# Patient Record
Sex: Male | Born: 1945 | Race: Black or African American | Hispanic: No | Marital: Married | Smoking: Former smoker
Health system: Southern US, Community
[De-identification: ages and names within clinical notes are randomized; demographics above are authoritative.]

## PROBLEM LIST (undated history)

## (undated) DIAGNOSIS — I429 Cardiomyopathy, unspecified: Secondary | ICD-10-CM

## (undated) DIAGNOSIS — H409 Unspecified glaucoma: Secondary | ICD-10-CM

## (undated) DIAGNOSIS — I10 Essential (primary) hypertension: Secondary | ICD-10-CM

## (undated) DIAGNOSIS — I2510 Atherosclerotic heart disease of native coronary artery without angina pectoris: Secondary | ICD-10-CM

## (undated) DIAGNOSIS — I219 Acute myocardial infarction, unspecified: Secondary | ICD-10-CM

## (undated) DIAGNOSIS — E119 Type 2 diabetes mellitus without complications: Secondary | ICD-10-CM

## (undated) DIAGNOSIS — N189 Chronic kidney disease, unspecified: Secondary | ICD-10-CM

## (undated) DIAGNOSIS — I499 Cardiac arrhythmia, unspecified: Secondary | ICD-10-CM

## (undated) DIAGNOSIS — E7800 Pure hypercholesterolemia, unspecified: Secondary | ICD-10-CM

---

## 2008-02-06 ENCOUNTER — Emergency Department: Payer: Self-pay | Admitting: Emergency Medicine

## 2008-02-06 ENCOUNTER — Other Ambulatory Visit: Payer: Self-pay

## 2008-02-09 ENCOUNTER — Ambulatory Visit: Payer: Self-pay | Admitting: Unknown Physician Specialty

## 2011-10-05 ENCOUNTER — Emergency Department: Payer: Self-pay | Admitting: *Deleted

## 2011-10-05 LAB — COMPREHENSIVE METABOLIC PANEL
Albumin: 3.7 g/dL (ref 3.4–5.0)
Alkaline Phosphatase: 69 U/L (ref 50–136)
Bilirubin,Total: 0.4 mg/dL (ref 0.2–1.0)
Co2: 26 mmol/L (ref 21–32)
Creatinine: 2.07 mg/dL — ABNORMAL HIGH (ref 0.60–1.30)
EGFR (African American): 41 — ABNORMAL LOW
EGFR (Non-African Amer.): 34 — ABNORMAL LOW
Glucose: 130 mg/dL — ABNORMAL HIGH (ref 65–99)
SGOT(AST): 30 U/L (ref 15–37)
SGPT (ALT): 27 U/L
Sodium: 138 mmol/L (ref 136–145)
Total Protein: 7.8 g/dL (ref 6.4–8.2)

## 2011-10-05 LAB — CBC
HCT: 33.6 % — ABNORMAL LOW (ref 40.0–52.0)
MCV: 86 fL (ref 80–100)
Platelet: 209 10*3/uL (ref 150–440)
RBC: 3.92 10*6/uL — ABNORMAL LOW (ref 4.40–5.90)
RDW: 14.1 % (ref 11.5–14.5)
WBC: 6.4 10*3/uL (ref 3.8–10.6)

## 2011-11-10 ENCOUNTER — Ambulatory Visit: Payer: Self-pay | Admitting: Gastroenterology

## 2011-11-12 LAB — PATHOLOGY REPORT

## 2012-07-28 ENCOUNTER — Ambulatory Visit: Payer: Self-pay | Admitting: Internal Medicine

## 2012-07-28 HISTORY — PX: V TACH ABLATION: EP1227

## 2012-07-29 ENCOUNTER — Ambulatory Visit: Payer: Self-pay | Admitting: Internal Medicine

## 2012-08-16 LAB — CBC CANCER CENTER
HGB: 10 g/dL — ABNORMAL LOW (ref 13.0–18.0)
MCH: 28.2 pg (ref 26.0–34.0)
MCHC: 33.3 g/dL (ref 32.0–36.0)
Platelet: 233 x10 3/mm (ref 150–440)
RBC: 3.55 10*6/uL — ABNORMAL LOW (ref 4.40–5.90)
RDW: 15.3 % — ABNORMAL HIGH (ref 11.5–14.5)
Segmented Neutrophils: 65 %

## 2012-08-16 LAB — IRON AND TIBC
Iron Bind.Cap.(Total): 241 ug/dL — ABNORMAL LOW (ref 250–450)
Iron Saturation: 30 %
Unbound Iron-Bind.Cap.: 169 ug/dL

## 2012-08-16 LAB — FERRITIN: Ferritin (ARMC): 292 ng/mL (ref 8–388)

## 2012-08-25 LAB — CBC CANCER CENTER
Basophil #: 0.1 x10 3/mm (ref 0.0–0.1)
Basophil %: 0.9 %
Eosinophil #: 0.3 x10 3/mm (ref 0.0–0.7)
HCT: 31.9 % — ABNORMAL LOW (ref 40.0–52.0)
Lymphocyte #: 1.5 x10 3/mm (ref 1.0–3.6)
MCHC: 33 g/dL (ref 32.0–36.0)
Monocyte #: 0.5 x10 3/mm (ref 0.2–1.0)
Neutrophil %: 65.6 %
RBC: 3.77 10*6/uL — ABNORMAL LOW (ref 4.40–5.90)

## 2012-08-28 ENCOUNTER — Ambulatory Visit: Payer: Self-pay | Admitting: Internal Medicine

## 2012-10-26 ENCOUNTER — Ambulatory Visit: Payer: Self-pay | Admitting: Internal Medicine

## 2012-11-22 LAB — CBC CANCER CENTER
Basophil #: 0 x10 3/mm (ref 0.0–0.1)
Basophil %: 0.9 %
Eosinophil #: 0.2 x10 3/mm (ref 0.0–0.7)
Eosinophil %: 4.3 %
HCT: 31.2 % — ABNORMAL LOW (ref 40.0–52.0)
Lymphocyte #: 1.3 x10 3/mm (ref 1.0–3.6)
MCH: 28 pg (ref 26.0–34.0)
MCHC: 33.3 g/dL (ref 32.0–36.0)
MCV: 84 fL (ref 80–100)
Monocyte #: 0.5 x10 3/mm (ref 0.2–1.0)
Neutrophil #: 3.5 x10 3/mm (ref 1.4–6.5)
Neutrophil %: 62.6 %
Platelet: 184 x10 3/mm (ref 150–440)
RBC: 3.72 10*6/uL — ABNORMAL LOW (ref 4.40–5.90)
RDW: 14.1 % (ref 11.5–14.5)
WBC: 5.6 x10 3/mm (ref 3.8–10.6)

## 2012-11-25 ENCOUNTER — Ambulatory Visit: Payer: Self-pay | Admitting: Internal Medicine

## 2013-01-20 ENCOUNTER — Inpatient Hospital Stay: Payer: Self-pay | Admitting: Internal Medicine

## 2013-01-20 LAB — URINALYSIS, COMPLETE
Bilirubin,UR: NEGATIVE
Glucose,UR: NEGATIVE mg/dL (ref 0–75)
Ketone: NEGATIVE
Leukocyte Esterase: NEGATIVE
Protein: 100
RBC,UR: <1 /HPF (ref 0–5)
Specific Gravity: 1.01 (ref 1.003–1.030)
Squamous Epithelial: 2
WBC UR: 10 /HPF (ref 0–5)

## 2013-01-20 LAB — CBC WITH DIFFERENTIAL/PLATELET
Basophil #: 0.1 10*3/uL (ref 0.0–0.1)
Basophil %: 1 %
Eosinophil %: 3.1 %
HCT: 27.3 % — ABNORMAL LOW (ref 40.0–52.0)
HGB: 9 g/dL — ABNORMAL LOW (ref 13.0–18.0)
Lymphocyte #: 1.1 10*3/uL (ref 1.0–3.6)
Lymphocyte %: 18.5 %
MCV: 84 fL (ref 80–100)
Monocyte #: 0.6 x10 3/mm (ref 0.2–1.0)
Monocyte %: 9 %
Neutrophil #: 4.2 10*3/uL (ref 1.4–6.5)
Neutrophil %: 68.4 %
Platelet: 147 10*3/uL — ABNORMAL LOW (ref 150–440)
WBC: 6.2 10*3/uL (ref 3.8–10.6)

## 2013-01-20 LAB — COMPREHENSIVE METABOLIC PANEL
BUN: 56 mg/dL — ABNORMAL HIGH (ref 7–18)
Bilirubin,Total: 0.3 mg/dL (ref 0.2–1.0)
Calcium, Total: 8.2 mg/dL — ABNORMAL LOW (ref 8.5–10.1)
Co2: 24 mmol/L (ref 21–32)
Creatinine: 2.83 mg/dL — ABNORMAL HIGH (ref 0.60–1.30)
EGFR (African American): 25 — ABNORMAL LOW
EGFR (Non-African Amer.): 22 — ABNORMAL LOW
Osmolality: 308 (ref 275–301)
Potassium: 4.8 mmol/L (ref 3.5–5.1)
SGOT(AST): 24 U/L (ref 15–37)
SGPT (ALT): 30 U/L (ref 12–78)
Sodium: 144 mmol/L (ref 136–145)

## 2013-01-20 LAB — TROPONIN I: Troponin-I: <0.02 ng/mL

## 2013-01-20 LAB — HEMOGLOBIN A1C: Hemoglobin A1C: 6.4 % — ABNORMAL HIGH (ref 4.2–6.3)

## 2013-01-20 LAB — MAGNESIUM: Magnesium: 2 mg/dL

## 2013-01-21 LAB — BASIC METABOLIC PANEL
Calcium, Total: 8.4 mg/dL — ABNORMAL LOW (ref 8.5–10.1)
Chloride: 115 mmol/L — ABNORMAL HIGH (ref 98–107)
Co2: 23 mmol/L (ref 21–32)
Osmolality: 304 (ref 275–301)
Potassium: 5.1 mmol/L (ref 3.5–5.1)
Sodium: 143 mmol/L (ref 136–145)

## 2013-01-21 LAB — CBC WITH DIFFERENTIAL/PLATELET
Basophil %: 0.5 %
Eosinophil #: 0.2 10*3/uL (ref 0.0–0.7)
Eosinophil %: 2.5 %
HCT: 28.5 % — ABNORMAL LOW (ref 40.0–52.0)
Lymphocyte %: 14.3 %
MCV: 85 fL (ref 80–100)
Monocyte #: 0.7 x10 3/mm (ref 0.2–1.0)
RDW: 15.3 % — ABNORMAL HIGH (ref 11.5–14.5)
WBC: 8.2 10*3/uL (ref 3.8–10.6)

## 2013-01-22 LAB — CBC WITH DIFFERENTIAL/PLATELET
Basophil #: 0 10*3/uL (ref 0.0–0.1)
Basophil %: 0.4 %
Eosinophil #: 0.2 10*3/uL (ref 0.0–0.7)
Eosinophil %: 2.4 %
HCT: 28.5 % — ABNORMAL LOW (ref 40.0–52.0)
HGB: 9.6 g/dL — ABNORMAL LOW (ref 13.0–18.0)
MCH: 28.7 pg (ref 26.0–34.0)
MCHC: 33.6 g/dL (ref 32.0–36.0)
MCV: 85 fL (ref 80–100)
Neutrophil %: 65.8 %
Platelet: 140 10*3/uL — ABNORMAL LOW (ref 150–440)
RBC: 3.33 10*6/uL — ABNORMAL LOW (ref 4.40–5.90)
WBC: 9.9 10*3/uL (ref 3.8–10.6)

## 2013-01-22 LAB — IRON AND TIBC
Iron Bind.Cap.(Total): 211 ug/dL — ABNORMAL LOW (ref 250–450)
Iron: 27 ug/dL — ABNORMAL LOW (ref 65–175)
Unbound Iron-Bind.Cap.: 184 ug/dL

## 2013-01-22 LAB — BASIC METABOLIC PANEL
BUN: 48 mg/dL — ABNORMAL HIGH (ref 7–18)
Calcium, Total: 8.5 mg/dL (ref 8.5–10.1)
Co2: 18 mmol/L — ABNORMAL LOW (ref 21–32)
EGFR (African American): 26 — ABNORMAL LOW
EGFR (Non-African Amer.): 22 — ABNORMAL LOW
Osmolality: 296 (ref 275–301)
Potassium: 4.5 mmol/L (ref 3.5–5.1)

## 2013-01-22 LAB — FERRITIN: Ferritin (ARMC): 312 ng/mL (ref 8–388)

## 2013-01-23 LAB — BASIC METABOLIC PANEL
Calcium, Total: 8.8 mg/dL (ref 8.5–10.1)
EGFR (Non-African Amer.): 21 — ABNORMAL LOW
Osmolality: 296 (ref 275–301)

## 2013-01-24 LAB — CBC WITH DIFFERENTIAL/PLATELET
Basophil #: 0 10*3/uL (ref 0.0–0.1)
Basophil %: 0.5 %
Eosinophil #: 0.2 10*3/uL (ref 0.0–0.7)
Eosinophil %: 1.7 %
HCT: 26.6 % — ABNORMAL LOW (ref 40.0–52.0)
HGB: 8.9 g/dL — ABNORMAL LOW (ref 13.0–18.0)
Lymphocyte #: 1.5 10*3/uL (ref 1.0–3.6)
Lymphocyte %: 15.7 %
MCH: 28.3 pg (ref 26.0–34.0)
MCHC: 33.3 g/dL (ref 32.0–36.0)
MCV: 85 fL (ref 80–100)
Monocyte %: 9.3 %
Neutrophil %: 72.8 %
Platelet: 203 10*3/uL (ref 150–440)
RDW: 15 % — ABNORMAL HIGH (ref 11.5–14.5)
WBC: 9.7 10*3/uL (ref 3.8–10.6)

## 2013-01-24 LAB — BASIC METABOLIC PANEL
Anion Gap: 7 (ref 7–16)
Anion Gap: 10 (ref 7–16)
BUN: 61 mg/dL — ABNORMAL HIGH (ref 7–18)
Calcium, Total: 8.2 mg/dL — ABNORMAL LOW (ref 8.5–10.1)
Chloride: 113 mmol/L — ABNORMAL HIGH (ref 98–107)
Chloride: 109 mmol/L — ABNORMAL HIGH (ref 98–107)
Creatinine: 3.68 mg/dL — ABNORMAL HIGH (ref 0.60–1.30)
Creatinine: 4.45 mg/dL — ABNORMAL HIGH (ref 0.60–1.30)
EGFR (African American): 18 — ABNORMAL LOW
EGFR (African American): 15 — ABNORMAL LOW
EGFR (Non-African Amer.): 16 — ABNORMAL LOW
Glucose: 103 mg/dL — ABNORMAL HIGH (ref 65–99)
Glucose: 323 mg/dL — ABNORMAL HIGH (ref 65–99)
Osmolality: 301 (ref 275–301)
Potassium: 4.7 mmol/L (ref 3.5–5.1)
Potassium: 5.9 mmol/L — ABNORMAL HIGH (ref 3.5–5.1)
Sodium: 138 mmol/L (ref 136–145)

## 2013-01-24 LAB — MAGNESIUM: Magnesium: 2.2 mg/dL

## 2013-01-24 LAB — CK TOTAL AND CKMB (NOT AT ARMC): CK-MB: 0.9 ng/mL (ref 0.5–3.6)

## 2013-01-25 ENCOUNTER — Ambulatory Visit: Payer: Self-pay | Admitting: Internal Medicine

## 2013-02-21 LAB — CBC CANCER CENTER
Basophil #: 0 x10 3/mm (ref 0.0–0.1)
Basophil %: 0.6 %
Eosinophil #: 0.2 x10 3/mm (ref 0.0–0.7)
Eosinophil %: 3 %
HGB: 9.2 g/dL — ABNORMAL LOW (ref 13.0–18.0)
Lymphocyte %: 11.2 %
MCH: 28.4 pg (ref 26.0–34.0)
MCHC: 33.3 g/dL (ref 32.0–36.0)
Monocyte #: 0.6 x10 3/mm (ref 0.2–1.0)
Monocyte %: 8.3 %
Platelet: 218 x10 3/mm (ref 150–440)
RDW: 14.9 % — ABNORMAL HIGH (ref 11.5–14.5)
WBC: 7.7 x10 3/mm (ref 3.8–10.6)

## 2013-02-21 LAB — FERRITIN: Ferritin (ARMC): 422 ng/mL — ABNORMAL HIGH (ref 8–388)

## 2013-02-21 LAB — RETICULOCYTES
Absolute Retic Count: 0.0695 10*6/uL
Reticulocyte: 2.16 %

## 2013-02-25 ENCOUNTER — Ambulatory Visit: Payer: Self-pay | Admitting: Internal Medicine

## 2013-03-01 ENCOUNTER — Other Ambulatory Visit: Payer: Self-pay

## 2013-03-01 ENCOUNTER — Emergency Department: Payer: Self-pay | Admitting: Emergency Medicine

## 2013-03-01 LAB — CBC
MCHC: 33.5 g/dL (ref 32.0–36.0)
MCV: 85 fL (ref 80–100)
RBC: 3.33 10*6/uL — ABNORMAL LOW (ref 4.40–5.90)
RDW: 15.2 % — ABNORMAL HIGH (ref 11.5–14.5)

## 2013-03-01 LAB — CK TOTAL AND CKMB (NOT AT ARMC): CK-MB: 0.6 ng/mL (ref 0.5–3.6)

## 2013-03-01 LAB — COMPREHENSIVE METABOLIC PANEL
Alkaline Phosphatase: 108 U/L (ref 50–136)
Anion Gap: 6 — ABNORMAL LOW (ref 7–16)
Bilirubin,Total: 0.6 mg/dL (ref 0.2–1.0)
Calcium, Total: 8.9 mg/dL (ref 8.5–10.1)
Chloride: 105 mmol/L (ref 98–107)
Co2: 26 mmol/L (ref 21–32)
EGFR (Non-African Amer.): 21 — ABNORMAL LOW
Osmolality: 291 (ref 275–301)
SGPT (ALT): 41 U/L (ref 12–78)
Sodium: 137 mmol/L (ref 136–145)

## 2013-03-01 LAB — PRO B NATRIURETIC PEPTIDE: B-Type Natriuretic Peptide: 13505 pg/mL — ABNORMAL HIGH (ref 0–125)

## 2013-03-01 LAB — TROPONIN I: Troponin-I: 0.03 ng/mL

## 2013-07-28 LAB — COMPREHENSIVE METABOLIC PANEL
ALBUMIN: 2.6 g/dL — AB (ref 3.4–5.0)
ALK PHOS: 90 U/L
AST: 47 U/L — AB (ref 15–37)
Anion Gap: 6 — ABNORMAL LOW (ref 7–16)
BILIRUBIN TOTAL: 0.2 mg/dL (ref 0.2–1.0)
BUN: 30 mg/dL — ABNORMAL HIGH (ref 7–18)
Calcium, Total: 8.2 mg/dL — ABNORMAL LOW (ref 8.5–10.1)
Chloride: 105 mmol/L (ref 98–107)
Co2: 27 mmol/L (ref 21–32)
Creatinine: 2.19 mg/dL — ABNORMAL HIGH (ref 0.60–1.30)
GFR CALC AF AMER: 34 — AB
GFR CALC NON AF AMER: 30 — AB
Glucose: 146 mg/dL — ABNORMAL HIGH (ref 65–99)
Osmolality: 285 (ref 275–301)
Potassium: 3.8 mmol/L (ref 3.5–5.1)
SGPT (ALT): 46 U/L (ref 12–78)
SODIUM: 138 mmol/L (ref 136–145)
Total Protein: 6.7 g/dL (ref 6.4–8.2)

## 2013-07-28 LAB — CBC
HCT: 33.9 % — AB (ref 40.0–52.0)
HGB: 11 g/dL — ABNORMAL LOW (ref 13.0–18.0)
MCH: 26.6 pg (ref 26.0–34.0)
MCHC: 32.6 g/dL (ref 32.0–36.0)
MCV: 82 fL (ref 80–100)
Platelet: 216 10*3/uL (ref 150–440)
RBC: 4.15 10*6/uL — ABNORMAL LOW (ref 4.40–5.90)
RDW: 15 % — ABNORMAL HIGH (ref 11.5–14.5)
WBC: 12.6 10*3/uL — ABNORMAL HIGH (ref 3.8–10.6)

## 2013-07-28 LAB — TROPONIN I

## 2013-07-29 ENCOUNTER — Inpatient Hospital Stay: Payer: Self-pay | Admitting: Internal Medicine

## 2013-07-29 LAB — URINALYSIS, COMPLETE
BACTERIA: NONE SEEN
Bilirubin,UR: NEGATIVE
KETONE: NEGATIVE
LEUKOCYTE ESTERASE: NEGATIVE
Nitrite: NEGATIVE
Ph: 5 (ref 4.5–8.0)
Protein: >=500
SPECIFIC GRAVITY: 1.013 (ref 1.003–1.030)

## 2013-07-30 LAB — BASIC METABOLIC PANEL
ANION GAP: 4 — AB (ref 7–16)
BUN: 28 mg/dL — AB (ref 7–18)
CO2: 26 mmol/L (ref 21–32)
CREATININE: 2.09 mg/dL — AB (ref 0.60–1.30)
Calcium, Total: 7.9 mg/dL — ABNORMAL LOW (ref 8.5–10.1)
Chloride: 109 mmol/L — ABNORMAL HIGH (ref 98–107)
EGFR (African American): 36 — ABNORMAL LOW
EGFR (Non-African Amer.): 31 — ABNORMAL LOW
Glucose: 138 mg/dL — ABNORMAL HIGH (ref 65–99)
OSMOLALITY: 285 (ref 275–301)
Potassium: 3.7 mmol/L (ref 3.5–5.1)
SODIUM: 139 mmol/L (ref 136–145)

## 2013-07-31 LAB — URINE CULTURE

## 2014-02-14 HISTORY — PX: EYE SURGERY: SHX253

## 2014-02-22 HISTORY — PX: EYE SURGERY: SHX253

## 2014-08-25 HISTORY — PX: EYE SURGERY: SHX253

## 2014-11-15 IMAGING — CT CT HEAD WITHOUT CONTRAST
1 series · 16 of 30 positions shown, 20 images · non-contrast
Comparison: None.

CLINICAL DATA: Fall with contusion to head.  Altered mental status.

EXAM:
CT HEAD WITHOUT CONTRAST
TECHNIQUE: Contiguous axial images were obtained from the base of the skull
through the vertex without intravenous contrast.

[Series 2: head wo · axial · 0.41mm/px · z∈[-46,+89]mm · 16 of 34 slices shown, 20 images]
[im 2/34  brain]
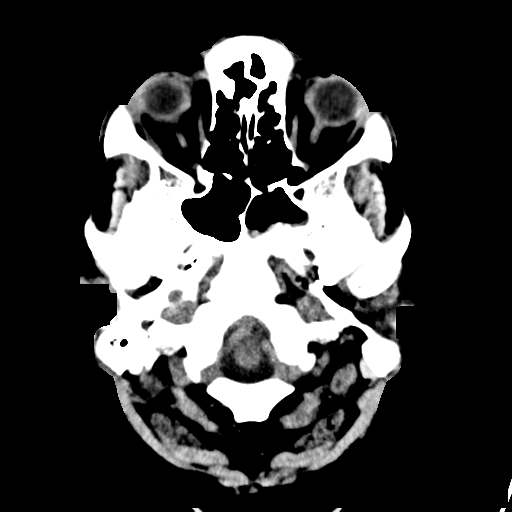
[im 2/34  bone]
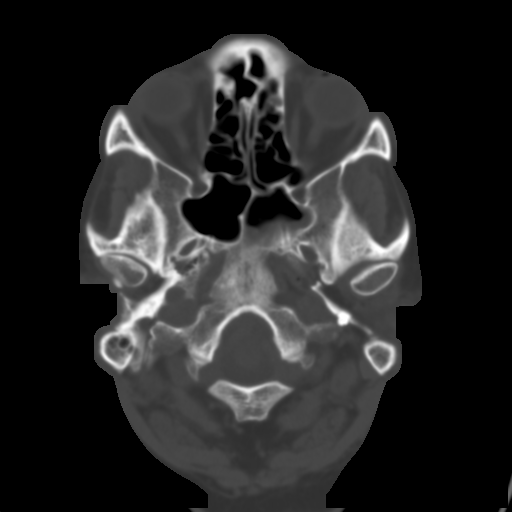
[im 4/34  brain]
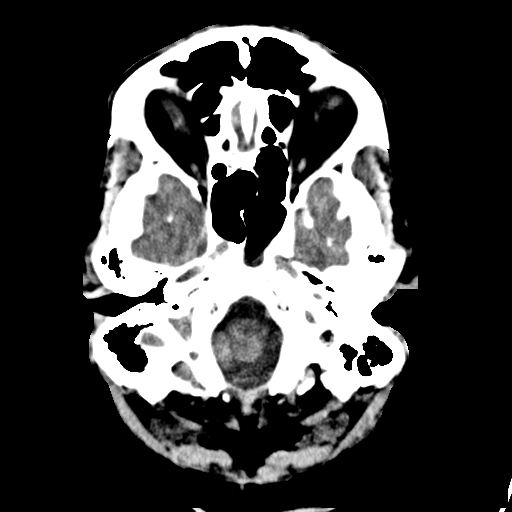
[im 6/34  brain]
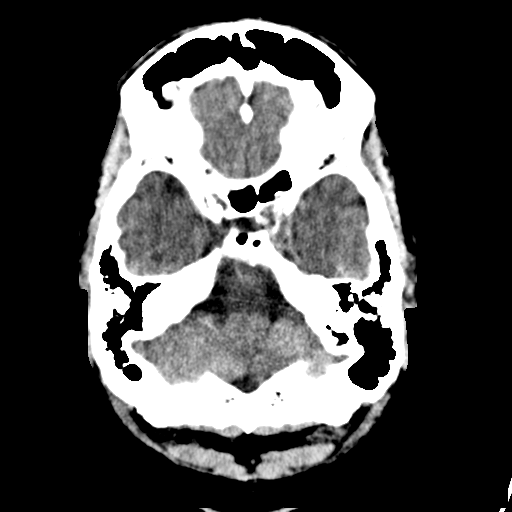
[im 8/34  brain]
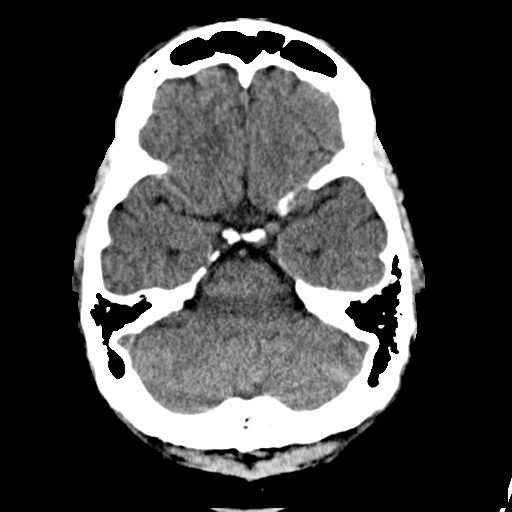
[im 10/34  brain]
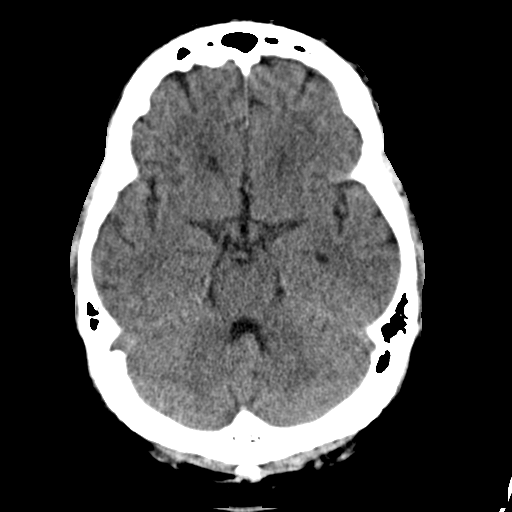
[im 10/34  bone]
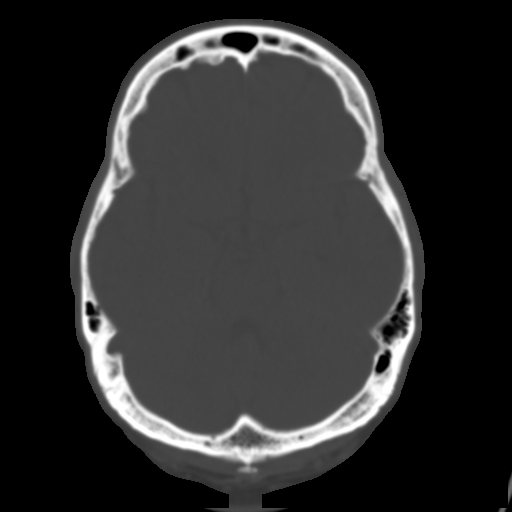
[im 12/34  brain]
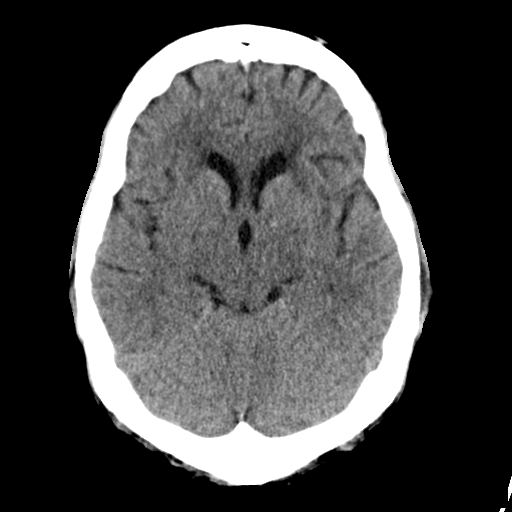
[im 14/34  brain]
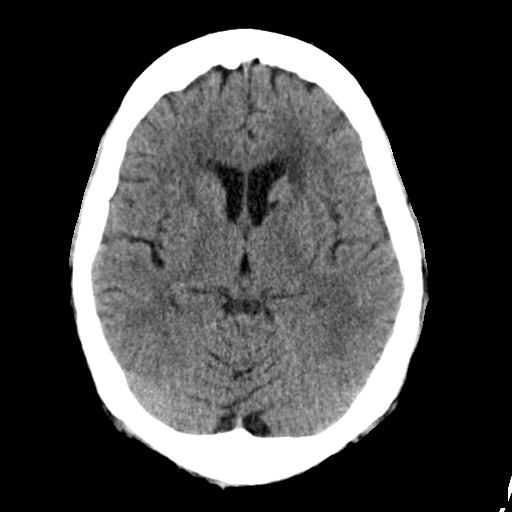
[im 16/34  brain]
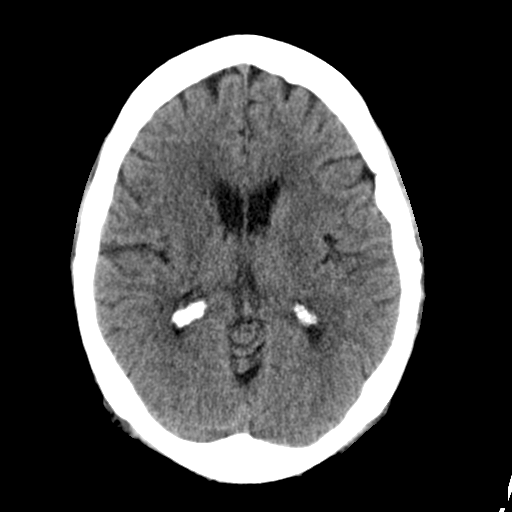
[im 18/34  brain]
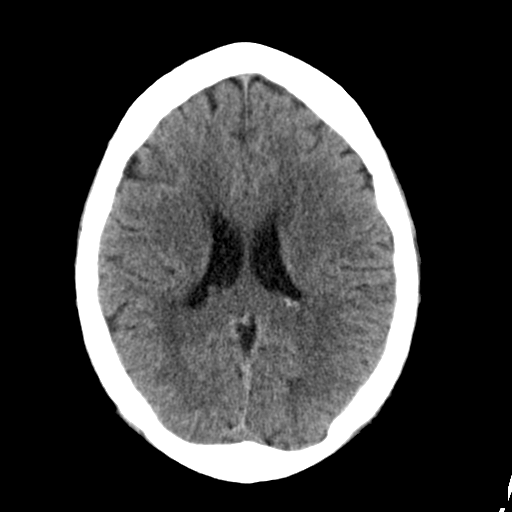
[im 18/34  bone]
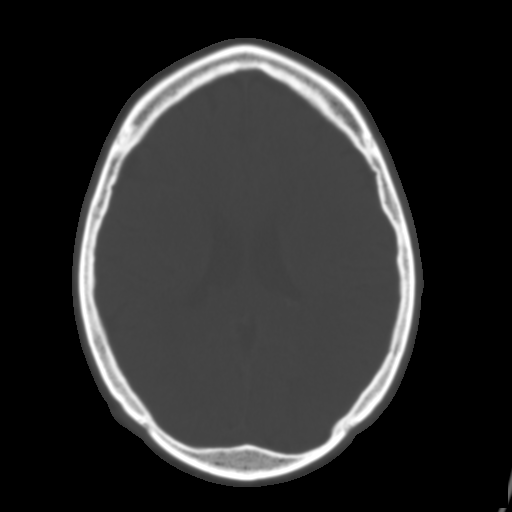
[im 20/34  brain]
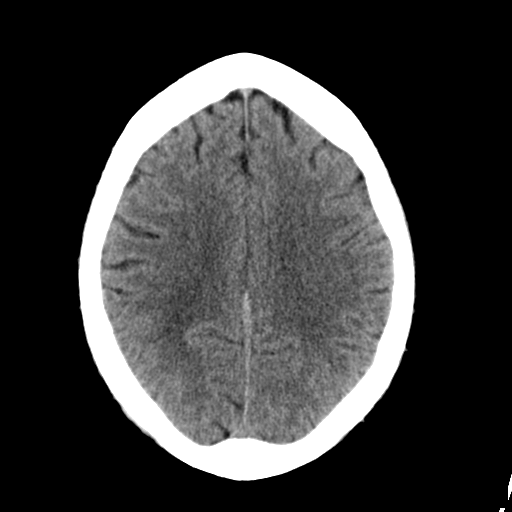
[im 22/34  brain]
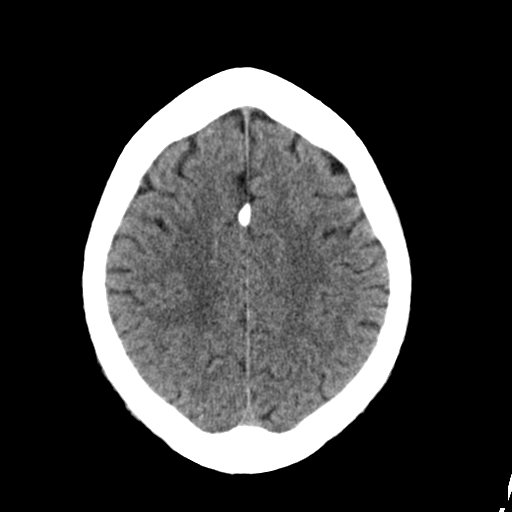
[im 24/34  brain]
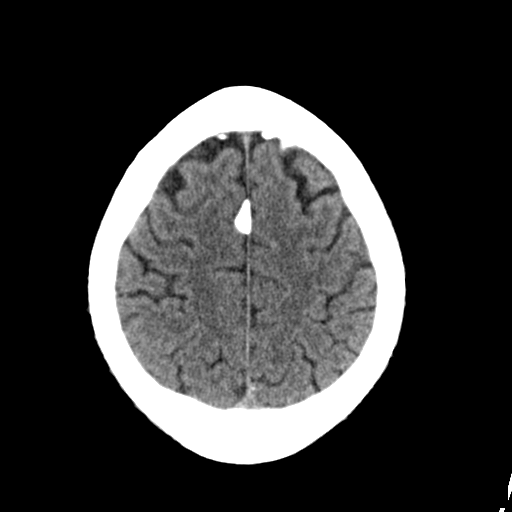
[im 26/34  brain]
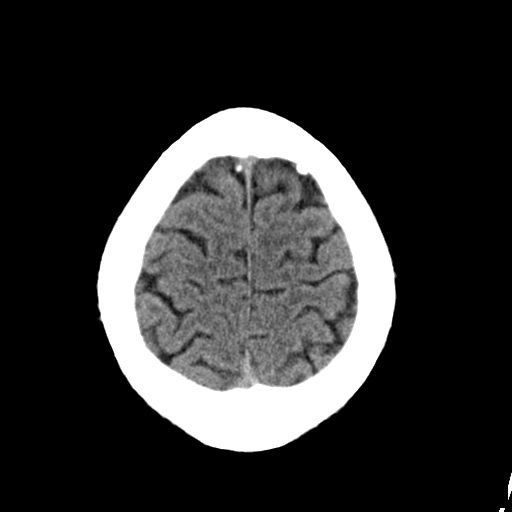
[im 26/34  bone]
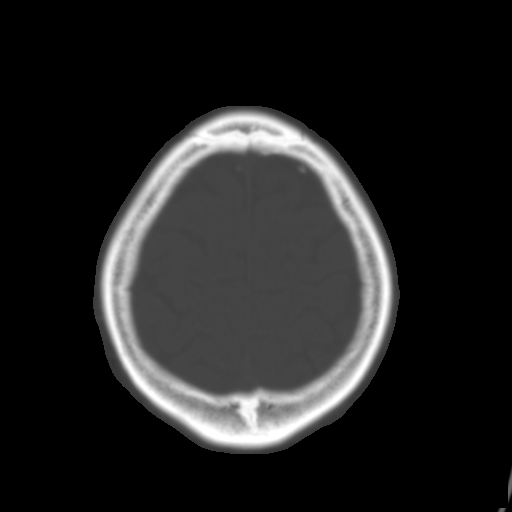
[im 28/34  brain]
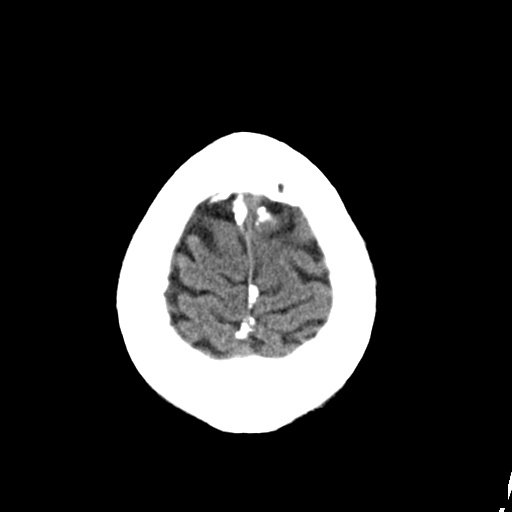
[im 30/34  brain]
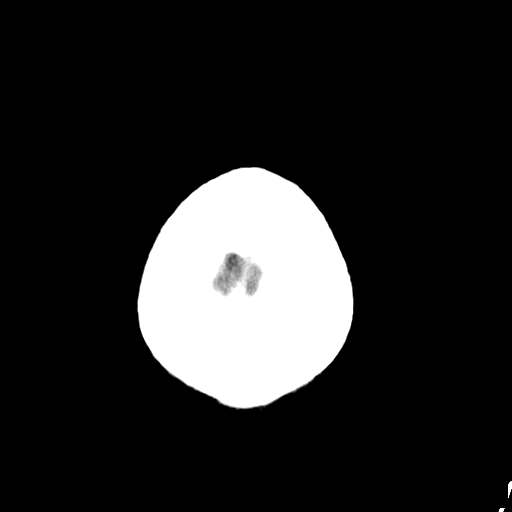
[im 32/34  brain]
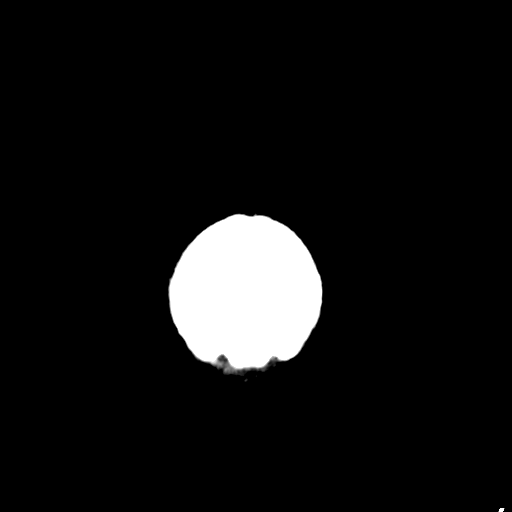

[16 of 30 positions shown; findings below may reference images not displayed]

FINDINGS: With focal area of low attenuation in the left anterior frontal
white matter suggesting age-indeterminate area of ischemia. Old
lacunar infarcts in the left basal ganglion. No mass effect or
midline shift. No abnormal extra-axial fluid collections. Gray-white
matter junctions are distinct. Basal cisterns are not effaced. No
evidence of acute intracranial hemorrhage. No depressed skull
fractures. Mucosal thickening in the sphenoid sinus and ethmoid air
cells. Vascular calcifications.
IMPRESSION: Focal area of low attenuation in the left anterior frontal deep
white matter suggesting age indeterminate area of ischemia. No acute
intracranial hemorrhage or mass effect.

## 2014-11-17 NOTE — Consult Note (Signed)
PATIENT NAME:  Nathaniel Nguyen, Nathaniel Nguyen MR#:  [id number] DATE OF BIRTH:  [birth year]  DATE OF CONSULTATION:  01/20/2013  REFERRING PHYSICIAN:  Dr. Ginette Pitman  CONSULTING PHYSICIAN:  Isaias Cowman, MD  PRIMARY CARE PHYSICIAN:  Dr. Kem Kays.   CHIEF COMPLAINT: "I have been fatigued."   REASON FOR VISIT: Patient referred for evaluation of ventricular tachycardia.   HISTORY OF PRESENT ILLNESS:  The patient is a 69 year old gentleman with history of diabetes, hypertension and chronic kidney disease. He has a one-week history where he has been feeling fatigued with exertional shortness of breath without chest pain. He presented to [facility] today, saw Dr. Ginette Pitman, who noted bradycardia on exam. EKG revealed ventricular bigeminy. The patient was admitted to the CCU, where he has demonstrated frequent ventricular ectopy with nonsustained runs of ventricular tachycardia. The patient denies chest pain and appears clinically stable. Blood pressure is borderline low. Admission labs were notable for a negative troponin less than 0.02, BNP of 2843, BUN and creatinine of 56 and 2.83, respectively. Potassium was normal. The patient is anemic with a hemoglobin and hematocrit of 9.0 and 27.3, respectively. The patient has had a history of chronic anemia.   PAST MEDICAL HISTORY: 1.  Type 2 diabetes.  2.  Hypertension.  3.  Chronic kidney disease, followed by [facility].  4.  Hyperlipidemia.   MEDICATIONS ON ADMISSION:  Aspirin 81 mg daily, glimepiride 4 mg daily, Januvia 50 mg daily,  Enalapril/HCTZ 10/25 b.i.d.   SOCIAL HISTORY: The patient is married. Denies tobacco abuse.   FAMILY HISTORY: No immediate family history of coronary disease or myocardial infarction.   REVIEW OF SYSTEMS:  CONSTITUTIONAL: No fever or chills.  EYES: No blurry vision.  EARS: No hearing loss.  RESPIRATORY: The patient has had some exertional dyspnea for one week.  GASTROINTESTINAL: No nausea, vomiting, diarrhea or  constipation.  GENITOURINARY: No dysuria or hematuria.  ENDOCRINE: No polyuria or polydipsia.  MUSCULOSKELETAL: No arthralgias or myalgias.  NEUROLOGICAL: No focal muscle weakness or numbness.  PSYCHOLOGICAL: No depression or anxiety.   PHYSICAL EXAMINATION: VITAL SIGNS: Blood pressure 146/58, pulse 80, respirations 27.  HEENT: Pupils equal, reactive to light and accommodation.  NECK: Supple without thyromegaly.  LUNGS: Clear.  HEART: Normal JVP. Normal PMI. Regular rate and rhythm. Normal S1, S2. No appreciable gallop, murmur or rub.  ABDOMEN: Soft and nontender. Pulses were intact bilaterally.  MUSCULOSKELETAL: Normal muscle tone.  NEUROLOGIC: The patient is alert and oriented x 3. Motor and sensory both grossly intact.   IMPRESSION: A 69 year old gentleman with a one week history of fatigue, exertional dyspnea who presents with frequent nonsustained runs of ventricular tachycardia, absence of chest pain and negative troponin. The patient has chronic kidney disease with otherwise normal electrolytes. The patient denies presyncope or syncope.   RECOMMENDATIONS: 1.  Agree with overall current therapy.  2.  Would defer full dose anticoagulation.  3.  Amiodarone bolus and drip.  4.  Review 2-D echocardiogram.  5.  Will obtain a second opinion from [place].  6.  Will keep the patient nothing oral for possible cardiac catheterization in morning to rule out significant coronary artery disease.    ____________________________ Isaias Cowman, MD ap:cc D: 01/20/2013 16:03:00 ET T: 01/20/2013 17:17:57 ET JOB#: [id number]  cc: Isaias Cowman, MD, <Dictator> Isaias Cowman MD ELECTRONICALLY SIGNED 01/22/2013 10:09

## 2014-11-17 NOTE — Consult Note (Signed)
Brief Consult Note: Diagnosis: Ventricular tachycardia, ? etiology, complicated by CKD, anemia.   Patient was seen by consultant.   Consult note dictated.   Comments: REC  Agree with current therapy, defer full dose anticoagulation, consider cardiac catheterization though high risk for serious contrast induced nephrotoxicity, second opinion from [place] in am, amiodarone bolus and drip, magnesium 1 gm bolus, review echo.  Electronic Signatures: Isaias Cowman (MD)  (Signed 26-Jun-14 16:06)  Authored: Brief Consult Note   Last Updated: 26-Jun-14 16:06 by Isaias Cowman (MD)

## 2014-11-17 NOTE — Discharge Summary (Signed)
PATIENT NAME:  Nathaniel Nguyen, Nathaniel Nguyen MR#:  [id number] DATE OF BIRTH:  1945/08/15  DATE OF ADMISSION:  01/20/2013 DATE OF DISCHARGE:  01/24/2013  DISCHARGE DIAGNOSES:  1.  Wide-complex tachycardia, most likely ventricular tachycardia.  2.  Type 2 diabetes.  3.  Chronic kidney disease.  4.  Hypertension.   CHIEF COMPLAINT: Weakness, shortness of breath, fatigue.   HISTORY OF PRESENT ILLNESS: Nathaniel Nguyen is a 69 year old male with a history of type 2 diabetes, hypertension, chronic kidney disease stage III, presented to the [facility] complaining of generalized weakness and shortness of breath associated with diaphoresis. The patient also has been experiencing a sense of fatigue and gets short of breath easily, also sometimes feels like he is going to pass out but denied any definite syncopal events, denies any chest pains or palpitations.  In the clinic, EKG showed evidence of sinus rhythm with bigeminy and heart rate of 45. There was no evidence of any acute ischemic changes. Because of symptomatic bradycardia, he was admitted to the CCU.   PAST MEDICAL HISTORY:  Significant for: 1.  Type 2 diabetes.  2.  Hypertension.  3.  Diabetic retinopathy.  4.  Hyperlipidemia.  5.  Chronic kidney disease.   PHYSICAL EXAMINATION:  GENERAL:  Initially, he was not in distress.  VITAL SIGNS: Weight was 207, blood pressure 128/62, temperature was 98, pulse rate 40 by  auscultation.  HEENT: NCAT.  HEART: S1, S2 bradycardic with frequent ectopy, 2/6 systolic ejection murmur plus.  LUNGS: Clear to auscultation.  ABDOMEN: Soft, nontender.  EXTREMITIES: No edema.   HOSPITAL COURSE: The patient was admitted to the CCU and initially tried on amiodarone, but he continued to have periods of irregular heart rhythm with broad complexes consistent with v-tach. he remained relatively asymtomatic over the weekend but had episodes of tachyarrythmia with h.rate sometimess as high as 200 per minute.He had lab work done  which showed glucose of 209, BNP was 2843, BUN was 56, creatinine 2.83, sodium 144, potassium 4.8, chloride 114, CO2 24. Magnesium was 2.  A1c was 6.4, total protein was 6.2. Albumin was 3. Troponin less than 0.02. TSH 1.7, hemoglobin 9, hematocrit 27.3, platelets 147. The patient was subsequently moved out of the CCU but continued to have episodes of tachycardia with heart rate going as high as 200. He was also evaluated by Dr. Saralyn Pilar, cardiologist. On the afternoon of 01/24/2013, he had an episode of diaphoresis associated with hypotension and tachycardia.Pt was treated with IV N.Saline  blous for Hypotension  He was given IV amiodarone bolus followed by an amiodarone drip and transferred back to the CCU.Discussed with cardiologist Dr. Saralyn Pilar  and it was felt the patient would benefit from evaluation at a tertiary center for possible electrophysiological studies and was transferred to [facility]. Discussed with family in detail who verbalized their understanding.    Total time spent in discharge of patient and co ordination of care: 45 minutes ____________________________ Tracie Harrier, MD vh:cb D: 01/24/2013 13:50:19 ET T: 01/24/2013 14:01:33 ET JOB#: [id number]  cc: Tracie Harrier, MD, <Dictator> Tracie Harrier MD ELECTRONICALLY SIGNED 02/08/2013 17:34

## 2014-11-17 NOTE — Discharge Summary (Signed)
PATIENT NAME:  Nathaniel Nguyen, Nathaniel Nguyen MR#:  [id number] DATE OF BIRTH:  10/15/1945  DATE OF ADMISSION:  01/20/2013 DATE OF DISCHARGE:  01/23/2013  PRIMARY CARE PROVIDER: Aileen Miller, MD.  CONSULTANT:  Alexander Paraschos, MD.   DISCHARGE DIAGNOSES 1.  Symptomatic bradycardia.  2.  Nonsustained ventricular tachycardia.   3.  Type 2 diabetes with ophthalmologic complications.  4.  Diabetic retinopathy.  5.  Hypertension.  6.  Hyperlipidemia.  7.  Stage 4 chronic kidney disease.   HISTORY AND PHYSICAL: This is a 69-year-old male who presented to internal medicine clinic at [facility] for weakness and shortness of breath. Symptoms had been ongoing for 1 week. He was found to be bradycardic and in bigeminy with a rate of 45.   HOSPITAL COURSE: The patient was admitted to the CCU where he was noted to demonstrate frequent ventricular ectopy with bouts of nonsustained ventricle tachycardia. Admission labs were notable for a negative troponin of less than 0.02, BNP 2843, and a BUN and creatinine of 56 and 2.83. He was anemic with a hematocrit of 27.3. Cardiology was consulted. He was initiated on amiodarone bolus followed by IV infusion of amiodarone. An echocardiogram was obtained as well as a chest x-ray. Chest x-ray was unremarkable. Echo demonstrated normal left ventricular ejection fraction with an EF of 50% to 55% percent, moderate MR, mild elevated pulmonary arterial pressure and mild/moderate TR. He was felt on the second hospital day not to demonstrate any improvement on amiodarone and was then initiated on immediate-release verapamil.  He seemed to be doing well without any episodes of further NSVT. He was transferred to the ward and on his last hospital day, switched to extended-release verapamil. Renal insufficiency did not improve and is felt to be chronic in nature.   LAB STUDIES: On January 23, 2013, glucose 115, BUN 51, creatinine 2.95, sodium 141, potassium 4.7, eGFR 24.   DISCHARGE  MEDICATIONS 1.  Verapamil extended-release 240 mg once daily.  2.  Januvia 50 mg daily.  3.  Aspirin 325 mg daily.  4.  Glimepiride 4 mg daily.  5.  Enalapril/HCTZ 10/25 mg b.i.d.  6.  Levemir 6 units at bedtime.  7.  Omeprazole delayed-release 20 mg once daily.  8.  Multivitamin 1 tab daily.   DISCHARGE INSTRUCTIONS 1.  Stopped aspirin 81 and start 325 mg daily.  2.  Follow up with Dr. Paraschos in 1 to 2 weeks.  3.  Follow up with PCP. The patient has a scheduled initial visit with Dr. Singh on March 24, 2013.   ____________________________ A. Melissa Solum, MD ams:cs D: 01/23/2013 11:36:00 ET T: 01/23/2013 20:26:05 ET JOB#: [id number]  cc: Alexander Paraschos, MD [facility] Jasmine Singh, MD, [facility] A. Melissa Solum, MD, <Dictator>  A. MELISSA SOLUM MD ELECTRONICALLY SIGNED 01/26/2013 16:57 

## 2014-11-17 NOTE — H&P (Signed)
PATIENT NAME:  Nathaniel Nguyen, Nathaniel Nguyen MR#:  [id number] DATE OF BIRTH:  [birth year]  DATE OF ADMISSION:  01/20/2013  ADMITTING PHYSICIAN: Dr. Tracie Harrier.   PRIMARY CARE PHYSICIAN: Former patient of Dr. Kem Kays.   HISTORY OF PRESENT ILLNESS: Nathaniel Nguyen is a 69 year old male with a medical history significant for type 2 diabetes mellitus, hypertension, chronic kidney disease stage III, who  present to [facility] today for evaluation of weakness and shortness of breath. The patient states 1 week ago he was mowing the lawn. He became diaphoretic and went inside. Shortly thereafter was incredibly fatigued, had to sit in a chair for the rest of the day. Anytime he tried to walk he became short of breath. Since then has been more short of breath and unable to exert himself due to shortness of breath and lightheadedness with exertion. He states he feels like he is going to pass out when he walks. He denies any syncopal episodes. He has not had any chest pain or pressure. No palpitations. Denies nausea or vomiting. He has not had any fevers or chills or any other illnesses recently. Denies any change in diet. He was recently seen by [facility] for chronic kidney disease, and they increased his enalapril to 10 mg b.i.d.   On presentation at [facility] EKG was obtained. This demonstrated sinus rhythm with bigeminy, with a rate of approximately 45. There were no acute ischemic changes noted. Due to symptomatic bradycardia the patient will be admitted and evaluated for acute cardiac event and for evaluation of bradycardia.   PAST MEDICAL HISTORY: 1.  Diabetes mellitus type 2.  2.  Hypertension.  3.  Diabetic retinopathy.  4.  Hypercholesterolemia.  5.  Chronic kidney disease stage III.   PAST SURGICAL HISTORY: No past surgical history.  SOCIAL HISTORY: The patient is divorced. Does not smoke currently, but has in the past. Social alcohol use. He is retired.     FAMILY HISTORY: Significant  for cancer in his father, Alzheimer's in his mother.   REVIEW OF SYSTEMS:  GENERAL: Denies fevers, weight change, night sweats.  SKIN: Denies skin rash.  HEENT: Denies unusual headaches, visual changes, double vision, hearing changes, sore throat, neck pain or swelling of lymph nodes.  CARDIOVASCULAR: Positive for shortness of breath, dyspnea on exertion, mild edema. Denies chest pain.  RESPIRATORY: Positive for wheezing. No new cough.  GASTROINTESTINAL: No nausea, vomiting, diarrhea, melena, or hematochezia.  GU: Denies urinary frequency, urgency, hematuria.  MUSCULOSKELETAL: No joint pains, redness, swelling.  NEUROLOGIC: No facial weakness, extremity weakness, paresthesias. No change in balance or syncope.  ENDOCRINE: No heat or cold intolerance. Positive for diaphoresis.  PSYCHOLOGICAL: No mood changes.   PHYSICAL EXAMINATION: GENERAL: He is in no acute respiratory distress.  VITAL SIGNS: Today weight is 207, blood pressure 128/62, temperature 98.0, pulse rate is  40 by auscultation. Pulse ox 100% on room air, suggesting normal oxygenation.  HEENT: Pupils equal, round, reactive to light and accommodate. Extraocular movements intact. Posterior oropharynx is clear. Oral mucosa are moist.  HEART: Bradycardic rate, regular rhythm. Frequent ectopy heard, with a 2/6 systolic murmur heard at the left upper sternal border.  LUNGS: Diminished breath sounds throughout, but no wheezing, rhonchi, or crackles.  ABDOMEN: Positive bowel sounds in all 4 quadrants. Soft, nontender, nondistended.  EXTREMITIES: 1+ pitting edema to mid-shin.   DIAGNOSTIC DATA: EKG: Sinus rhythm, with bigeminy. No acute ischemic changes noted. Rate approximately 45.   IMPRESSION AND PLAN: 1.  Symptomatic bradycardia, becoming presyncopal  with any exertion. Blood pressure is currently stable but heart rate is in the 40s. Will admit for further evaluation and to rule out acute ischemic event.  2.  Chronic kidney disease,  stage III, followed by [facility].  3.  Type 2 diabetes mellitus.  4.  Hyperlipidemia.   PLAN: The patient will be admitted for evaluation of acute cardiac event. Will evaluate fluid status closely. Will obtain echocardiogram. Obtain chest x-ray. Will have cardiology evaluate for need for pacemaker. Will evaluate electrolytes closely, with chronic kidney disease. Will monitor sugars and use sliding-scale insulin as needed.   This patient was seen, re-examined, and all medical decisions were made by Dr. Tracie Harrier.    ____________________________ Paulita Cradle, PA-C mm:dm D: 01/20/2013 13:01:28 ET T: 01/20/2013 13:58:15 ET JOB#: [id number]  cc: Paulita Cradle, PA-C, <Dictator> Kimmerly Lora J Harith Mccadden PA ELECTRONICALLY SIGNED 02/08/2013 13:20

## 2014-11-17 NOTE — Consult Note (Signed)
Referring Physician: Dr Ginette Pitman  Reason for consult: CKD, AKI  HPI: Mr Nathaniel Nguyen is a 69 year old man with CKD likely from DM. He last saw Dr Johnney Ou in the [facility] on 12/29/12. At that time, his Cr was 2.2. His baseline Cr is in the 2 range. He now is admitted to [facility] with SOB and weakness. He notes that 1 week ago he noted weakness in his knees. He thereafter started feeling SOB and fatigued. The symptoms continued and worsened (especially the SOB). He was seen at his PCP's office yesterday and is now admitted for further evaluation. There is a concern for his symptoms coming from his cardiac arrythmia.   Of note, at his last appointment with Dr Johnney Ou, she increased his enalapril to 40 mg qd and continued HCTZ 25 mg qd.  Currently, the patient admits to anorexia.   PMH:  Elevated PSA- s/p prostate biopsy CKD - baseline Cr 2.2 DM- microalbumnuria, retinopathy HTN Obesity Renal cyst Microalbuminuria  ALL: metformin SH: h/o tobacco FH: not obtained  EXAM: 138/51  96 RA P 94  R 21  I/O 1.2/1.3 Gen: sitting in bed, NAD Lungs: clear throughout CV: not regular, "coupled", no rub Abd: soft, mildly distended Ext: left ankle edema Neuro: alert, conversant  Studies:  TTE EF 50-55% Mod Mr  6/27 Na 143, K 5.1, C02 23, Cr 2.9, Ca 8.4, Hgb 9.5 6/26 Phos 3.9, Cr 2.8, Alb 3.0, Hgb 49.24  A/P: 69 year old AAM with DM, HTN, CKD (BASELINE 2.2), now with AKI, non-oliguric in setting of arrythmia  1) AKI- discussed with the patient that his renal function is above baseline. We also discussed the risks of a contrast exposure (ie cardiac catherization) to include further injury to his kidney that may precipitate dialysis. He understands. For now, no plans for cardiac catherization. I have stopped the patient's HCTZ for now and asked him to increase PO fluids. It is possible that he has some hemodynamic compromise in the setting of his arrythmia that is contributing to loss of renal  function. Continue enalapril for now. Keep SBP >110. If renal function deteriorates further, may need to reduce dose of enalapril. No indication for renal US.  No acute indications for dialysis.  2) HTN- if needed, since I stopped his HCTZ, increase verapamil.  3) Anemia- Hgb 9.5. Monitor for now. Will initiate ESA if drops further. Check iron studies with AM labs.  4) CKD3/4 at baseline. Phos 3.9 on 6/26.   5) Hypoalbuminemia- supportive care for now. Monitor I/O qd.  I am available by page at [phone] with any questions on 6/28-6/29.   Electronic Signatures: Trinda Pascal (MD)  (Signed on 27-Jun-14 18:24)  Authored  Last Updated: 27-Jun-14 18:24 by Trinda Pascal (MD)

## 2014-11-18 NOTE — H&P (Signed)
PATIENT NAME:  Nathaniel Nguyen, Nathaniel Nguyen MR#:  [id number] DATE OF BIRTH:  Sep 08, 1945  DATE OF ADMISSION:  07/29/2013  REFERRING PHYSICIAN:  Dr. Owens Shark.   PRIMARY CARE PHYSICIAN:  Dr. Kem Kays.   CHIEF COMPLAINT:  Low blood glucose.   HISTORY OF PRESENT ILLNESS:  A 69 year old African American gentleman with history of type 2 diabetes presenting with hypoglycemia.  He describes two day duration of intermittent hypoglycemia with the lowest low yesterday of 32.  At that time he had a fall with mild head trauma when he tripped and hit his head on the living room coffee table.  He had low blood glucose once again today in the 20s.  His family gave him juice as well as a piece of pie to try to bring his blood sugar up without results.  At that time EMS was called and despite low blood glucose the patient himself says he has no symptoms, however daughter at bedside states that he has been fatigued and weak for the last two day duration.  He has been given an amp of D50, a glucose of 29 with initial improvement followed by hypoglycemia.   REVIEW OF SYSTEMS:  CONSTITUTIONAL:  Positive for fatigue and weakness.  Denies fever or pain.  EYES:  Denies blurred vision, double vision, eye pain.  EARS, NOSE, THROAT:  Denies tinnitus, ear pain, hearing loss.  RESPIRATORY:  Denies cough, wheeze, shortness of breath.  CARDIOVASCULAR:  Denies chest pain, palpitations, edema.  GASTROINTESTINAL:  Denies nausea, vomiting, diarrhea, abdominal pain.  GENITOURINARY:  Denies dysuria, hematuria.  ENDOCRINE:  Denies nocturia or thyroid problems.  HEMATOLOGIC AND LYMPHATIC:  Denies easy bruising or bleeding.  SKIN:  Denies rash or lesion.  MUSCULOSKELETAL:  Denies pain in neck, back, shoulder, knees, hips or arthritic symptoms.  NEUROLOGIC:  Denies paralysis, paresthesias.  PSYCHIATRIC:  Denies any anxiety or depressive symptoms.  Otherwise, full review of systems performed by me is negative.   PAST MEDICAL HISTORY:  Type 2  diabetes formerly insulin-requiring, was taken off of this for about two months, hypertension, chronic kidney disease, hyperlipidemia, A-Fib status post ablation.   SOCIAL HISTORY:  Remote history of tobacco use.  Occasional alcohol usage.  No drug usage.   FAMILY HISTORY:  Positive for coronary artery disease as well as diabetes.   ALLERGIES:  METFORMIN.   HOME MEDICATIONS:  Include atorvastatin 40 mg by mouth daily, aspirin 81 mg by mouth daily, Plavix 75 mg by mouth daily, enalapril 20 mg 2 tablets once daily, Lasix 20 mg as needed for swelling, glimepiride 4 mg by mouth twice daily, hydralazine 100 mg by mouth 3 times daily, hydrochlorothiazide 25 mg by mouth daily, Isordil 10 mg by mouth 3 times daily, Januvia 50 mg by mouth daily, metoprolol 100 mg extended release by mouth twice daily, Ranexa 500 mg by mouth twice daily, atenolol 0.5% ophthalmic solution to the left eye two drops daily.   PHYSICAL EXAMINATION: VITAL SIGNS:  Temperature 98.9, heart rate 107, respirations 18, blood pressure 150/82, saturating 96% on room air.  Weight 84.8 kg, BMI 28.4.  GENERAL:  Well-nourished, well-developed, African American gentleman in no acute distress.  HEAD:  Normocephalic.  There is an approximately 2 x 3 cm excoriation over the right eyebrow.  EYES:  Pupils equal, round, reactive to light, extraocular muscles intact.  No scleral icterus.  MOUTH:  Moist mucous membranes.  Dentition intact.  No abscess noted.   EAR, NOSE, THROAT:  Throat clear without exudates.  No external lesions.  NECK:  Supple.  No thyromegaly.  No nodules.  No JVD.  PULMONARY:  Clear to auscultation bilaterally without wheezes, rales or rhonchi.  No use of accessory muscles.  Good respiratory effort.  CHEST:  Nontender to palpation.  CARDIOVASCULAR:  S1, S2, regular rate and rhythm.  No murmurs, rubs or gallops.  No edema.  Pedal pulses 2+ bilaterally.  GASTROINTESTINAL:  Soft, nontender, nondistended.  No masses.  Positive  bowel sounds.  No hepatosplenomegaly.  MUSCULOSKELETAL:  No swelling, clubbing or edema.  Range of motion full in all extremities.  NEUROLOGIC:  Cranial nerves II through XII intact.  No gross focal motor neurological deficits.  Sensation intact.  SKIN:  No ulcerations, lesions, rash or cyanosis other than had exploration as mentioned above.  Skin warm, dry.  Turgor is intact.  PSYCHIATRIC:  Mood and affect blunted.  He is however, awake, alert and oriented x 3.  Insight and judgment intact.   LABORATORY DATA:  Glucose on arrival 27, sodium 138, potassium 3.8, chloride 105, bicarb 27, BUN 30, creatinine 2.19, glucose 146, currently 79, total protein 6.7, albumin 2.6, bili 0.2, alk phos 90, AST 47, ALT 46.  WBC 12.6, hemoglobin 11, platelets of 216.   ASSESSMENT AND PLAN:  A 69 year old gentleman with history of diabetes, presenting with hypoglycemia, lowest blood glucose of 29.  1.  Hypoglycemia, not on insulin therapy.  He is however on Januvia and glimepiride, question if these are too high of doses as an outpatient, given chronic kidney disease.  Regardless, he has been given D50 x 1 with some improvement of symptoms, however his glucose once again dropped.  He is now started on D10.  We will follow Accu-Cheks q. 1 hour.  If normal x 2 can space these out to q. 2 hours, however if remains low despite D10 he will need an increase of this as well as the addition of Octreotide drip at either 125 mcg an hour versus subcutaneous octreotide of 50 mcg q. 6 hours for residual hypoglycemia.  2.  Diabetes.  Hold all agents given #1.  3.  Hypertension.  Continue enalapril, hydralazine, Isordil and Lopressor.   4.  Coronary artery disease.  Aspirin, Plavix, statin.  5.  VTE prophylaxis with heparin subQ.  6.  CODE STATUS:  THE PATIENT IS A FULL CODE.   Critical care time spent 45 minutes.    ____________________________ Aaron Mose. Santos Sollenberger, MD dkh:ea D: 07/29/2013 01:35:13 ET T: 07/29/2013 01:52:12  ET JOB#: [id number]  cc: Aaron Mose. Mahamadou Weltz, MD, <Dictator> Lyliana Dicenso Woodfin Ganja MD ELECTRONICALLY SIGNED 07/29/2013 2:39

## 2016-02-26 HISTORY — PX: EYE SURGERY: SHX253

## 2017-06-05 ENCOUNTER — Encounter: Payer: Self-pay | Admitting: *Deleted

## 2017-06-08 ENCOUNTER — Ambulatory Visit: Payer: Medicare Other | Admitting: Anesthesiology

## 2017-06-08 ENCOUNTER — Ambulatory Visit
Admission: RE | Admit: 2017-06-08 | Discharge: 2017-06-08 | Disposition: A | Discharge status: Home / Self Care | Payer: Medicare Other | Source: Ambulatory Visit | Attending: Gastroenterology | Admitting: Gastroenterology

## 2017-06-08 ENCOUNTER — Encounter
Admission: RE | Disposition: A | Discharge status: Home / Self Care | Payer: Self-pay | Source: Ambulatory Visit | Attending: Gastroenterology

## 2017-06-08 DIAGNOSIS — H409 Unspecified glaucoma: Secondary | ICD-10-CM | POA: Insufficient documentation

## 2017-06-08 DIAGNOSIS — Z7902 Long term (current) use of antithrombotics/antiplatelets: Secondary | ICD-10-CM | POA: Diagnosis not present

## 2017-06-08 DIAGNOSIS — I129 Hypertensive chronic kidney disease with stage 1 through stage 4 chronic kidney disease, or unspecified chronic kidney disease: Secondary | ICD-10-CM | POA: Diagnosis not present

## 2017-06-08 DIAGNOSIS — E1122 Type 2 diabetes mellitus with diabetic chronic kidney disease: Secondary | ICD-10-CM | POA: Insufficient documentation

## 2017-06-08 DIAGNOSIS — I252 Old myocardial infarction: Secondary | ICD-10-CM | POA: Diagnosis not present

## 2017-06-08 DIAGNOSIS — K5730 Diverticulosis of large intestine without perforation or abscess without bleeding: Secondary | ICD-10-CM | POA: Insufficient documentation

## 2017-06-08 DIAGNOSIS — N183 Chronic kidney disease, stage 3 (moderate): Secondary | ICD-10-CM | POA: Diagnosis not present

## 2017-06-08 DIAGNOSIS — Z7951 Long term (current) use of inhaled steroids: Secondary | ICD-10-CM | POA: Diagnosis not present

## 2017-06-08 DIAGNOSIS — Z1211 Encounter for screening for malignant neoplasm of colon: Secondary | ICD-10-CM | POA: Diagnosis present

## 2017-06-08 DIAGNOSIS — Z87891 Personal history of nicotine dependence: Secondary | ICD-10-CM | POA: Diagnosis not present

## 2017-06-08 DIAGNOSIS — Z7982 Long term (current) use of aspirin: Secondary | ICD-10-CM | POA: Insufficient documentation

## 2017-06-08 DIAGNOSIS — Z79899 Other long term (current) drug therapy: Secondary | ICD-10-CM | POA: Insufficient documentation

## 2017-06-08 DIAGNOSIS — E7800 Pure hypercholesterolemia, unspecified: Secondary | ICD-10-CM | POA: Diagnosis not present

## 2017-06-08 DIAGNOSIS — I429 Cardiomyopathy, unspecified: Secondary | ICD-10-CM | POA: Diagnosis not present

## 2017-06-08 DIAGNOSIS — I2510 Atherosclerotic heart disease of native coronary artery without angina pectoris: Secondary | ICD-10-CM | POA: Diagnosis not present

## 2017-06-08 HISTORY — DX: Essential (primary) hypertension: I10

## 2017-06-08 HISTORY — DX: Chronic kidney disease, unspecified: N18.9

## 2017-06-08 HISTORY — PX: COLONOSCOPY WITH PROPOFOL: SHX5780

## 2017-06-08 HISTORY — DX: Type 2 diabetes mellitus without complications: E11.9

## 2017-06-08 HISTORY — DX: Unspecified glaucoma: H40.9

## 2017-06-08 HISTORY — DX: Atherosclerotic heart disease of native coronary artery without angina pectoris: I25.10

## 2017-06-08 HISTORY — DX: Cardiac arrhythmia, unspecified: I49.9

## 2017-06-08 HISTORY — DX: Pure hypercholesterolemia, unspecified: E78.00

## 2017-06-08 HISTORY — DX: Acute myocardial infarction, unspecified: I21.9

## 2017-06-08 HISTORY — DX: Cardiomyopathy, unspecified: I42.9

## 2017-06-08 LAB — GLUCOSE, CAPILLARY: GLUCOSE-CAPILLARY: 79 mg/dL (ref 65–99)

## 2017-06-08 SURGERY — COLONOSCOPY WITH PROPOFOL
Anesthesia: General

## 2017-06-08 MED ORDER — LIDOCAINE HCL (CARDIAC) 20 MG/ML IV SOLN
INTRAVENOUS | Status: DC | PRN
  Administered 2017-06-08: 40 mg via INTRAVENOUS

## 2017-06-08 MED ORDER — PROPOFOL 10 MG/ML IV BOLUS
INTRAVENOUS | Status: DC | PRN
  Administered 2017-06-08: 350 mg via INTRAVENOUS

## 2017-06-08 MED ORDER — PROPOFOL 10 MG/ML IV BOLUS
INTRAVENOUS | Status: AC
  Filled 2017-06-08: qty 40

## 2017-06-08 MED ORDER — SODIUM CHLORIDE 0.9 % IV SOLN: INTRAVENOUS | Status: DC

## 2017-06-08 MED ORDER — PROPOFOL 10 MG/ML IV BOLUS
INTRAVENOUS | Status: AC
  Filled 2017-06-08: qty 20

## 2017-06-08 MED ORDER — LIDOCAINE HCL (PF) 2 % IJ SOLN
INTRAMUSCULAR | Status: AC
  Filled 2017-06-08: qty 10

## 2017-06-08 MED ORDER — SODIUM CHLORIDE 0.9 % IV SOLN
INTRAVENOUS | Status: DC
  Administered 2017-06-08: 07:00:00 via INTRAVENOUS

## 2017-06-08 NOTE — Transfer of Care (Signed)
Immediate Anesthesia Transfer of Care Note  Patient: Nathaniel Nguyen  Procedure(s) Performed: COLONOSCOPY WITH PROPOFOL (N/A )  Patient Location: PACU  Anesthesia Type:MAC  Level of Consciousness: sedated  Airway & Oxygen Therapy: Patient Spontanous Breathing and Patient connected to nasal cannula oxygen  Post-op Assessment: Report given to RN and Post -op Vital signs reviewed and stable  Post vital signs: Reviewed and stable  Last Vitals:  Vitals:   06/08/17 0707 06/08/17 0820  BP: (!) 170/83 (!) 123/58  Pulse: 66 63  Resp: 20 20  Temp: (!) 35.6 C (!) 36 C  SpO2: 100% 100%    Last Pain:  Vitals:   06/08/17 0820  TempSrc: Tympanic         Complications: No apparent anesthesia complications

## 2017-06-08 NOTE — Anesthesia Postprocedure Evaluation (Signed)
Anesthesia Post Note  Patient: Nathaniel Nguyen  Procedure(s) Performed: COLONOSCOPY WITH PROPOFOL (N/A )  Patient location during evaluation: Endoscopy Anesthesia Type: General Level of consciousness: awake and alert and oriented Pain management: pain level controlled Vital Signs Assessment: post-procedure vital signs reviewed and stable Respiratory status: spontaneous breathing, nonlabored ventilation and respiratory function stable Cardiovascular status: blood pressure returned to baseline and stable Postop Assessment: no signs of nausea or vomiting Anesthetic complications: no     Last Vitals:  Vitals:   06/08/17 0840 06/08/17 0900  BP: (!) 166/73   Pulse: (!) 59 60  Resp: 20 20  Temp:    SpO2: 100% 100%    Last Pain:  Vitals:   06/08/17 0820  TempSrc: Tympanic                 Aseret Hoffman

## 2017-06-08 NOTE — Op Note (Signed)
[facility] Gastroenterology Patient Name: Nathaniel Nguyen Procedure Date: 06/08/2017 7:42 AM MRN: [id number] Account #: [id number] Date of Birth: 1946-02-11 Admit Type: Outpatient Age: 71 Room: [facility] ENDO ROOM 3 Gender: Male Note Status: Finalized Procedure:            Colonoscopy Indications:          Screening for colorectal malignant neoplasm Providers:            Lollie Sails, MD Referring MD:         Glendon Axe (Referring MD) Medicines:            Monitored Anesthesia Care Complications:        No immediate complications. Procedure:            Pre-Anesthesia Assessment:                       - ASA Grade Assessment: III - A patient with severe                        systemic disease.                       After obtaining informed consent, the colonoscope was                        passed under direct vision. Throughout the procedure,                        the patient's blood pressure, pulse, and oxygen                        saturations were monitored continuously. The                        Colonoscope was introduced through the anus and                        advanced to the the cecum, identified by appendiceal                        orifice and ileocecal valve. The colonoscopy was                        unusually difficult due to poor bowel prep with stool                        present and significant looping. Successful completion                        of the procedure was aided by changing the patient to a                        supine position, changing the patient to a prone                        position, using manual pressure and lavage. The quality                        of the bowel preparation was fair. Findings:      Many small and large-mouthed diverticula were found  in the sigmoid       colon, descending colon, transverse colon and ascending colon.      The digital rectal exam was normal. Impression:           - Preparation of the  colon was fair.                       - Diverticulosis in the sigmoid colon, in the                        descending colon, in the transverse colon and in the                        ascending colon.                       - No specimens collected. Recommendation:       - Discharge patient to home.                       - Repeat colonoscopy in 5 years for screening purposes. Procedure Code(s):    --- Professional ---                       [phone], Colonoscopy, flexible; diagnostic, including                        collection of specimen(s) by brushing or washing, when                        performed (separate procedure) Diagnosis Code(s):    --- Professional ---                       Z12.11, Encounter for screening for malignant neoplasm                        of colon                       K57.30, Diverticulosis of large intestine without                        perforation or abscess without bleeding CPT copyright 2016 [employer]. All rights reserved. The codes documented in this report are preliminary and upon coder review may  be revised to meet current compliance requirements. Lollie Sails, MD 06/08/2017 8:24:59 AM This report has been signed electronically. Number of Addenda: 0 Note Initiated On: 06/08/2017 7:42 AM Scope Withdrawal Time: 0 hours 14 minutes 25 seconds  Total Procedure Duration: 0 hours 35 minutes 47 seconds       [facility]

## 2017-06-08 NOTE — Anesthesia Post-op Follow-up Note (Signed)
Anesthesia QCDR form completed.        

## 2017-06-08 NOTE — H&P (Signed)
Outpatient short stay form Pre-procedure 06/08/2017 7:36 AM Lollie Sails MD  Primary Physician: Dr. Glendon Axe  Reason for visit:  Colonoscopy  History of present illness:  Patient is a Nathaniel Nguyen presenting today as above. He tolerated his prep well. He does take both Plavix and aspirin both of which he is held for 5 days. He takes no other aspirin or blood thinning agents.    Current Facility-Administered Medications:  .  0.9 %  sodium chloride infusion, , Intravenous, Continuous, Lollie Sails, MD, Last Rate: 20 mL/hr at 06/08/17 0726 .  0.9 %  sodium chloride infusion, , Intravenous, Continuous, Lollie Sails, MD  Medications Prior to Admission  Medication Sig Dispense Refill Last Dose  . allopurinol (ZYLOPRIM) 100 MG tablet Take 100 mg daily by mouth.   06/03/2017  . aspirin EC 81 MG tablet Take 81 mg daily by mouth.   06/03/2017  . atorvastatin (LIPITOR) 40 MG tablet Take 40 mg at bedtime by mouth.   06/03/2017  . ciclopirox (PENLAC) 8 % solution Apply at bedtime topically. Apply over nail and surrounding skin. Apply daily over previous coat. After seven (7) days, may remove with alcohol and continue cycle.   06/03/2017  . clopidogrel (PLAVIX) 75 MG tablet Take 75 mg daily by mouth.   06/03/2017  . clotrimazole-betamethasone (LOTRISONE) cream Apply 1 application 2 (two) times daily topically.   06/03/2017  . colchicine 0.6 MG tablet Take 0.6 mg daily by mouth. As needed   06/03/2017  . fluticasone (FLONASE) 50 MCG/ACT nasal spray Place 2 sprays daily into both nostrils.   06/03/2017  . furosemide (LASIX) 20 MG tablet Take 20 mg daily by mouth. As needed   06/03/2017  . hydrALAZINE (APRESOLINE) 100 MG tablet Take 100 mg 3 (three) times daily by mouth.   06/03/2017  . IRON-VITAMIN C PO Take 1 tablet daily by mouth.   06/03/2017  . metoprolol succinate (TOPROL-XL) 100 MG 24 hr tablet Take 50 mg 2 (two) times daily by mouth. Take with or immediately following a meal.    06/03/2017  . sildenafil (REVATIO) 20 MG tablet Take 60 mg as needed by mouth.   06/03/2017  . timolol (TIMOPTIC) 0.25 % ophthalmic solution Place 1 drop daily into the left eye.   06/07/2017     Allergies  Allergen Reactions  . Metformin And Related      Past Medical History:  Diagnosis Date  . Cardiomyopathy ([place])   . Chronic kidney disease    chronic, stage III  . Coronary artery disease   . Diabetes mellitus without complication ([place])   . Dysrhythmia    hx V-Tach  . Glaucoma   . Hypercholesterolemia   . Hypertension   . 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    HEENT: Normocephalic atraumatic eyes are anicteric    Other:     Pertinant exam for procedure: Soft nontender nondistended bowel sounds positive normoactive    Planned proceedures: Colonoscopy and indicated procedures. I have discussed the risks benefits and complications of procedures to include not limited to bleeding, infection, perforation and the risk of sedation and the patient wishes to proceed.    Lollie Sails, MD Gastroenterology 06/08/2017  7:36 AM

## 2017-06-08 NOTE — Anesthesia Preprocedure Evaluation (Addendum)
Anesthesia Evaluation  Patient identified by MRN, date of birth, ID band Patient awake    Reviewed: Allergy & Precautions, NPO status , Patient's Chart, lab work & pertinent test results  History of Anesthesia Complications Negative for: history of anesthetic complications  Airway Mallampati: II  TM Distance: >3 FB Neck ROM: Full    Dental no notable dental hx.    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)  Rhythm:Regular Rate:Normal - Systolic murmurs and - Diastolic murmurs Echo 4/62/86: NORMAL LEFT VENTRICULAR SYSTOLIC FUNCTION WITH AN ESTIMATED EF = 55-60 % NORMAL RIGHT VENTRICULAR SYSTOLIC FUNCTION MILD TRICUSPID VALVE INSUFFICIENCY NO VALVULAR STENOSIS   Neuro/Psych negative neurological ROS  negative psych ROS   GI/Hepatic negative GI ROS, Neg liver ROS,   Endo/Other  diabetes (diet controlled)  Renal/GU Renal InsufficiencyRenal disease     Musculoskeletal negative musculoskeletal ROS (+)   Abdominal (+) - obese,   Peds  Hematology negative hematology ROS (+)   Anesthesia Other Findings Past Medical History: No date: Cardiomyopathy ([place]) No date: Chronic kidney disease     Comment:  chronic, stage III No date: Coronary artery disease No date: Diabetes mellitus without complication ([facility]) No date: Dysrhythmia     Comment:  hx V-Tach No date: Glaucoma No date: Hypercholesterolemia No date: Hypertension No date: Myocardial infarction ([place])     Comment:  non Q wave MI   Reproductive/Obstetrics                             Anesthesia Physical Anesthesia Plan  ASA: III  Anesthesia Plan: General   Post-op Pain Management:    Induction: Intravenous  PONV Risk Score  and Plan: 2 and Propofol infusion  Airway Management Planned: Natural Airway  Additional Equipment:   Intra-op Plan:   Post-operative Plan:   Informed Consent: I have reviewed the patients History and Physical, chart, labs and discussed the procedure including the risks, benefits and alternatives for the proposed anesthesia with the patient or authorized representative who has indicated his/her understanding and acceptance.   Dental advisory given  Plan Discussed with: CRNA and Anesthesiologist  Anesthesia Plan Comments:         Anesthesia Quick Evaluation

## 2017-06-09 ENCOUNTER — Encounter: Payer: Self-pay | Admitting: Gastroenterology

## 2019-05-03 ENCOUNTER — Other Ambulatory Visit: Payer: Self-pay

## 2019-05-03 DIAGNOSIS — Z20822 Contact with and (suspected) exposure to covid-19: Secondary | ICD-10-CM

## 2019-05-05 LAB — NOVEL CORONAVIRUS, NAA: SARS-CoV-2, NAA: NOT DETECTED

## 2019-06-14 ENCOUNTER — Other Ambulatory Visit: Payer: Self-pay

## 2019-06-14 DIAGNOSIS — Z20822 Contact with and (suspected) exposure to covid-19: Secondary | ICD-10-CM

## 2019-06-16 LAB — NOVEL CORONAVIRUS, NAA: SARS-CoV-2, NAA: NOT DETECTED

## 2020-03-03 ENCOUNTER — Other Ambulatory Visit: Payer: Self-pay | Admitting: Family Medicine

## 2020-03-03 DIAGNOSIS — R4182 Altered mental status, unspecified: Secondary | ICD-10-CM

## 2020-03-03 DIAGNOSIS — R410 Disorientation, unspecified: Secondary | ICD-10-CM

## 2020-03-05 ENCOUNTER — Other Ambulatory Visit: Payer: Self-pay | Admitting: Family Medicine

## 2020-03-05 DIAGNOSIS — R4182 Altered mental status, unspecified: Secondary | ICD-10-CM

## 2020-03-05 DIAGNOSIS — I10 Essential (primary) hypertension: Secondary | ICD-10-CM

## 2020-03-05 DIAGNOSIS — I5189 Other ill-defined heart diseases: Secondary | ICD-10-CM

## 2020-03-20 ENCOUNTER — Other Ambulatory Visit: Payer: Self-pay

## 2020-03-20 ENCOUNTER — Ambulatory Visit
Admission: RE | Admit: 2020-03-20 | Discharge: 2020-03-20 | Disposition: A | Discharge status: Home / Self Care | Payer: Medicare PPO | Source: Ambulatory Visit | Attending: Family Medicine | Admitting: Family Medicine

## 2020-03-20 DIAGNOSIS — R410 Disorientation, unspecified: Secondary | ICD-10-CM

## 2020-03-20 DIAGNOSIS — R4182 Altered mental status, unspecified: Secondary | ICD-10-CM | POA: Insufficient documentation

## 2020-03-27 ENCOUNTER — Ambulatory Visit: Payer: Medicare PPO | Attending: Family Medicine

## 2020-12-28 ENCOUNTER — Inpatient Hospital Stay: Payer: Medicare PPO

## 2020-12-28 ENCOUNTER — Inpatient Hospital Stay
Admission: EM | Admit: 2020-12-28 | Discharge: 2021-01-05 | DRG: 683 | Disposition: A | Discharge status: Home / Self Care | Payer: Medicare PPO | Attending: Internal Medicine | Admitting: Internal Medicine

## 2020-12-28 ENCOUNTER — Emergency Department: Payer: Medicare PPO

## 2020-12-28 ENCOUNTER — Encounter: Payer: Self-pay | Admitting: Internal Medicine

## 2020-12-28 ENCOUNTER — Other Ambulatory Visit: Payer: Self-pay

## 2020-12-28 DIAGNOSIS — I252 Old myocardial infarction: Secondary | ICD-10-CM | POA: Diagnosis not present

## 2020-12-28 DIAGNOSIS — N186 End stage renal disease: Secondary | ICD-10-CM

## 2020-12-28 DIAGNOSIS — R197 Diarrhea, unspecified: Secondary | ICD-10-CM | POA: Diagnosis present

## 2020-12-28 DIAGNOSIS — R2681 Unsteadiness on feet: Secondary | ICD-10-CM | POA: Diagnosis present

## 2020-12-28 DIAGNOSIS — E785 Hyperlipidemia, unspecified: Secondary | ICD-10-CM | POA: Diagnosis present

## 2020-12-28 DIAGNOSIS — Z7984 Long term (current) use of oral hypoglycemic drugs: Secondary | ICD-10-CM

## 2020-12-28 DIAGNOSIS — R7401 Elevation of levels of liver transaminase levels: Secondary | ICD-10-CM | POA: Diagnosis present

## 2020-12-28 DIAGNOSIS — E1122 Type 2 diabetes mellitus with diabetic chronic kidney disease: Secondary | ICD-10-CM | POA: Diagnosis present

## 2020-12-28 DIAGNOSIS — I129 Hypertensive chronic kidney disease with stage 1 through stage 4 chronic kidney disease, or unspecified chronic kidney disease: Secondary | ICD-10-CM | POA: Diagnosis present

## 2020-12-28 DIAGNOSIS — D638 Anemia in other chronic diseases classified elsewhere: Secondary | ICD-10-CM

## 2020-12-28 DIAGNOSIS — N189 Chronic kidney disease, unspecified: Secondary | ICD-10-CM | POA: Diagnosis not present

## 2020-12-28 DIAGNOSIS — T466X5A Adverse effect of antihyperlipidemic and antiarteriosclerotic drugs, initial encounter: Secondary | ICD-10-CM | POA: Diagnosis present

## 2020-12-28 DIAGNOSIS — Z833 Family history of diabetes mellitus: Secondary | ICD-10-CM

## 2020-12-28 DIAGNOSIS — R531 Weakness: Secondary | ICD-10-CM

## 2020-12-28 DIAGNOSIS — I10 Essential (primary) hypertension: Secondary | ICD-10-CM

## 2020-12-28 DIAGNOSIS — D72829 Elevated white blood cell count, unspecified: Secondary | ICD-10-CM | POA: Diagnosis present

## 2020-12-28 DIAGNOSIS — N183 Chronic kidney disease, stage 3 unspecified: Secondary | ICD-10-CM | POA: Diagnosis not present

## 2020-12-28 DIAGNOSIS — H409 Unspecified glaucoma: Secondary | ICD-10-CM | POA: Diagnosis present

## 2020-12-28 DIAGNOSIS — I2510 Atherosclerotic heart disease of native coronary artery without angina pectoris: Secondary | ICD-10-CM | POA: Diagnosis present

## 2020-12-28 DIAGNOSIS — E860 Dehydration: Secondary | ICD-10-CM | POA: Diagnosis present

## 2020-12-28 DIAGNOSIS — Z888 Allergy status to other drugs, medicaments and biological substances status: Secondary | ICD-10-CM

## 2020-12-28 DIAGNOSIS — N184 Chronic kidney disease, stage 4 (severe): Secondary | ICD-10-CM | POA: Diagnosis present

## 2020-12-28 DIAGNOSIS — E876 Hypokalemia: Secondary | ICD-10-CM | POA: Diagnosis not present

## 2020-12-28 DIAGNOSIS — E11649 Type 2 diabetes mellitus with hypoglycemia without coma: Secondary | ICD-10-CM | POA: Diagnosis present

## 2020-12-28 DIAGNOSIS — N179 Acute kidney failure, unspecified: Principal | ICD-10-CM | POA: Diagnosis present

## 2020-12-28 DIAGNOSIS — Z87891 Personal history of nicotine dependence: Secondary | ICD-10-CM

## 2020-12-28 DIAGNOSIS — R4701 Aphasia: Secondary | ICD-10-CM | POA: Diagnosis present

## 2020-12-28 DIAGNOSIS — N281 Cyst of kidney, acquired: Secondary | ICD-10-CM | POA: Diagnosis present

## 2020-12-28 DIAGNOSIS — D631 Anemia in chronic kidney disease: Secondary | ICD-10-CM | POA: Diagnosis present

## 2020-12-28 DIAGNOSIS — E7800 Pure hypercholesterolemia, unspecified: Secondary | ICD-10-CM | POA: Diagnosis present

## 2020-12-28 DIAGNOSIS — I429 Cardiomyopathy, unspecified: Secondary | ICD-10-CM | POA: Diagnosis present

## 2020-12-28 DIAGNOSIS — E1169 Type 2 diabetes mellitus with other specified complication: Secondary | ICD-10-CM | POA: Diagnosis present

## 2020-12-28 DIAGNOSIS — Z7982 Long term (current) use of aspirin: Secondary | ICD-10-CM

## 2020-12-28 DIAGNOSIS — Z7902 Long term (current) use of antithrombotics/antiplatelets: Secondary | ICD-10-CM

## 2020-12-28 DIAGNOSIS — Z79899 Other long term (current) drug therapy: Secondary | ICD-10-CM

## 2020-12-28 DIAGNOSIS — Z20822 Contact with and (suspected) exposure to covid-19: Secondary | ICD-10-CM | POA: Diagnosis present

## 2020-12-28 LAB — HEPATITIS B CORE ANTIBODY, TOTAL: Hep B Core Total Ab: NONREACTIVE

## 2020-12-28 LAB — COMPREHENSIVE METABOLIC PANEL
ALT: 56 U/L — ABNORMAL HIGH (ref 0–44)
AST: 84 U/L — ABNORMAL HIGH (ref 15–41)
Albumin: 3.4 g/dL — ABNORMAL LOW (ref 3.5–5.0)
Alkaline Phosphatase: 83 U/L (ref 38–126)
Anion gap: 14 (ref 5–15)
BUN: 79 mg/dL — ABNORMAL HIGH (ref 8–23)
CO2: 19 mmol/L — ABNORMAL LOW (ref 22–32)
Calcium: 8.7 mg/dL — ABNORMAL LOW (ref 8.9–10.3)
Chloride: 102 mmol/L (ref 98–111)
Creatinine, Ser: 6.16 mg/dL — ABNORMAL HIGH (ref 0.61–1.24)
GFR, Estimated: 9 mL/min — ABNORMAL LOW (ref >60)
Glucose, Bld: 46 mg/dL — ABNORMAL LOW (ref 70–99)
Potassium: 3.6 mmol/L (ref 3.5–5.1)
Sodium: 135 mmol/L (ref 135–145)
Total Bilirubin: 1 mg/dL (ref 0.3–1.2)
Total Protein: 6.8 g/dL (ref 6.5–8.1)

## 2020-12-28 LAB — HEPATITIS B SURFACE ANTIBODY,QUALITATIVE: Hep B S Ab: NONREACTIVE

## 2020-12-28 LAB — PROTIME-INR
INR: 1.2 (ref 0.8–1.2)
Prothrombin Time: 14.9 seconds (ref 11.4–15.2)

## 2020-12-28 LAB — DIFFERENTIAL
Abs Immature Granulocytes: 0.11 10*3/uL — ABNORMAL HIGH (ref 0.00–0.07)
Basophils Absolute: 0 10*3/uL (ref 0.0–0.1)
Basophils Relative: 0 %
Eosinophils Absolute: 0.1 10*3/uL (ref 0.0–0.5)
Eosinophils Relative: 0 %
Immature Granulocytes: 1 %
Lymphocytes Relative: 3 %
Lymphs Abs: 0.5 10*3/uL — ABNORMAL LOW (ref 0.7–4.0)
Monocytes Absolute: 2.3 10*3/uL — ABNORMAL HIGH (ref 0.1–1.0)
Monocytes Relative: 12 %
Neutro Abs: 16.6 10*3/uL — ABNORMAL HIGH (ref 1.7–7.7)
Neutrophils Relative %: 84 %

## 2020-12-28 LAB — HEPATITIS B SURFACE ANTIGEN: Hepatitis B Surface Ag: NONREACTIVE

## 2020-12-28 LAB — RESP PANEL BY RT-PCR (FLU A&B, COVID) ARPGX2
Influenza A by PCR: NEGATIVE
Influenza B by PCR: NEGATIVE
SARS Coronavirus 2 by RT PCR: NEGATIVE

## 2020-12-28 LAB — CBC
HCT: 26.8 % — ABNORMAL LOW (ref 39.0–52.0)
Hemoglobin: 9.2 g/dL — ABNORMAL LOW (ref 13.0–17.0)
MCH: 28.7 pg (ref 26.0–34.0)
MCHC: 34.3 g/dL (ref 30.0–36.0)
MCV: 83.5 fL (ref 80.0–100.0)
Platelets: 192 10*3/uL (ref 150–400)
RBC: 3.21 MIL/uL — ABNORMAL LOW (ref 4.22–5.81)
RDW: 14.5 % (ref 11.5–15.5)
WBC: 19.7 10*3/uL — ABNORMAL HIGH (ref 4.0–10.5)
nRBC: 0.2 % (ref 0.0–0.2)

## 2020-12-28 LAB — APTT: aPTT: 30 seconds (ref 24–36)

## 2020-12-28 LAB — GLUCOSE, CAPILLARY: Glucose-Capillary: 140 mg/dL — ABNORMAL HIGH (ref 70–99)

## 2020-12-28 LAB — HEPATITIS B CORE ANTIBODY, IGM: Hep B C IgM: NONREACTIVE

## 2020-12-28 MED ORDER — ONDANSETRON HCL 4 MG PO TABS: 4.0000 mg | ORAL_TABLET | Freq: Four times a day (QID) | ORAL | Status: DC | PRN

## 2020-12-28 MED ORDER — ATORVASTATIN CALCIUM 20 MG PO TABS
40.0000 mg | ORAL_TABLET | Freq: Every day | ORAL | Status: DC
  Administered 2020-12-28 – 2021-01-02 (×6): 40 mg via ORAL
  Filled 2020-12-28 (×6): qty 2

## 2020-12-28 MED ORDER — LACTATED RINGERS IV BOLUS
1000.0000 mL | Freq: Once | INTRAVENOUS | Status: AC
  Administered 2020-12-28: 1000 mL via INTRAVENOUS

## 2020-12-28 MED ORDER — HEPARIN SODIUM (PORCINE) 5000 UNIT/ML IJ SOLN
5000.0000 [IU] | Freq: Three times a day (TID) | INTRAMUSCULAR | Status: DC
  Administered 2020-12-28 – 2021-01-05 (×22): 5000 [IU] via SUBCUTANEOUS
  Filled 2020-12-28 (×21): qty 1

## 2020-12-28 MED ORDER — AMLODIPINE BESYLATE 5 MG PO TABS: 5.0000 mg | ORAL_TABLET | Freq: Every day | ORAL | Status: DC

## 2020-12-28 MED ORDER — ACETAMINOPHEN 650 MG RE SUPP: 650.0000 mg | Freq: Four times a day (QID) | RECTAL | Status: DC | PRN

## 2020-12-28 MED ORDER — CLOPIDOGREL BISULFATE 75 MG PO TABS
75.0000 mg | ORAL_TABLET | Freq: Every day | ORAL | Status: DC
  Administered 2020-12-29 – 2021-01-03 (×6): 75 mg via ORAL
  Filled 2020-12-28 (×6): qty 1

## 2020-12-28 MED ORDER — CALCIUM CARB-CHOLECALCIFEROL 600-800 MG-UNIT PO TABS: 1.0000 | ORAL_TABLET | Freq: Every day | ORAL | Status: DC

## 2020-12-28 MED ORDER — FERROUS SULFATE 325 (65 FE) MG PO TABS
325.0000 mg | ORAL_TABLET | Freq: Every day | ORAL | Status: DC
  Administered 2020-12-29 – 2021-01-03 (×6): 325 mg via ORAL
  Filled 2020-12-28 (×6): qty 1

## 2020-12-28 MED ORDER — ONDANSETRON HCL 4 MG/2ML IJ SOLN: 4.0000 mg | Freq: Four times a day (QID) | INTRAMUSCULAR | Status: DC | PRN

## 2020-12-28 MED ORDER — ASCORBIC ACID 500 MG PO TABS
250.0000 mg | ORAL_TABLET | Freq: Every day | ORAL | Status: DC
  Administered 2020-12-29 – 2021-01-03 (×6): 250 mg via ORAL
  Filled 2020-12-28 (×6): qty 1

## 2020-12-28 MED ORDER — ALLOPURINOL 100 MG PO TABS
100.0000 mg | ORAL_TABLET | Freq: Every day | ORAL | Status: DC
  Administered 2020-12-29 – 2021-01-03 (×6): 100 mg via ORAL
  Filled 2020-12-28 (×6): qty 1

## 2020-12-28 MED ORDER — IRON-VITAMIN C 100-250 MG PO TABS: ORAL_TABLET | Freq: Every day | ORAL | Status: DC

## 2020-12-28 MED ORDER — ACETAMINOPHEN 325 MG PO TABS
650.0000 mg | ORAL_TABLET | Freq: Four times a day (QID) | ORAL | Status: DC | PRN
  Administered 2020-12-29: 650 mg via ORAL
  Filled 2020-12-28: qty 2

## 2020-12-28 MED ORDER — ASPIRIN EC 81 MG PO TBEC
81.0000 mg | DELAYED_RELEASE_TABLET | Freq: Every day | ORAL | Status: DC
  Administered 2020-12-29 – 2021-01-03 (×6): 81 mg via ORAL
  Filled 2020-12-28 (×6): qty 1

## 2020-12-28 MED ORDER — SODIUM CHLORIDE 0.9 % IV SOLN: INTRAVENOUS | Status: DC

## 2020-12-28 MED ORDER — METOPROLOL SUCCINATE ER 50 MG PO TB24
50.0000 mg | ORAL_TABLET | Freq: Two times a day (BID) | ORAL | Status: DC
  Administered 2020-12-28 – 2021-01-04 (×14): 50 mg via ORAL
  Filled 2020-12-28 (×14): qty 1

## 2020-12-28 MED ORDER — CALCIUM CARBONATE-VITAMIN D 500-200 MG-UNIT PO TABS
1.0000 | ORAL_TABLET | Freq: Every day | ORAL | Status: DC
  Administered 2020-12-29 – 2021-01-03 (×6): 1 via ORAL
  Filled 2020-12-28 (×7): qty 1

## 2020-12-28 MED ORDER — TIMOLOL MALEATE 0.25 % OP SOLN
1.0000 [drp] | Freq: Every day | OPHTHALMIC | Status: DC
  Administered 2020-12-29 – 2021-01-03 (×5): 1 [drp] via OPHTHALMIC
  Filled 2020-12-28 (×2): qty 5

## 2020-12-28 MED ORDER — STERILE WATER FOR INJECTION IV SOLN
INTRAVENOUS | Status: DC
  Filled 2020-12-28: qty 1000
  Filled 2020-12-28 (×2): qty 150
  Filled 2020-12-28 (×5): qty 1000
  Filled 2020-12-28: qty 150
  Filled 2020-12-28 (×5): qty 1000
  Filled 2020-12-28: qty 150

## 2020-12-28 NOTE — ED Notes (Signed)
Pt to MRI

## 2020-12-28 NOTE — ED Notes (Signed)
Per previous RN, pharm confirms variable results when hanging sodium bicarb mix fluid with LR. Therefore, will allow LR bolus to finish before hanging sodium bicarb mix.

## 2020-12-28 NOTE — ED Triage Notes (Signed)
Pt here with aphasia and gait unbalance that started on Thursday. Pt denies hitting his head. Pt here with family member.

## 2020-12-28 NOTE — ED Notes (Signed)
Patient taken to CT. Family at bedside.

## 2020-12-28 NOTE — ED Notes (Signed)
Family at bedside and brought the patient Nathaniel Nguyen. Hospitalist at bedside gave ok for the patient to eat. Waiting for IV team. Dr. Francine Graven and family aware. Patient is eating a chicken sandwich without difficulty.

## 2020-12-28 NOTE — ED Provider Notes (Signed)
[facility] Emergency Department Provider Note   ____________________________________________   Event Date/Time   First MD Initiated Contact with Patient 12/28/20 1208     (approximate)  I have reviewed the triage vital signs and the nursing notes.   HISTORY  Chief Complaint Aphasia    HPI Nathaniel Nguyen is a 75 y.o. male with past medical history of hypertension, hyperlipidemia, diabetes, CKD, and CAD who presents to the ED complaining of weakness.  Patient states that he has been feeling weak for about the past 7 days, has had occasional diarrhea with poor p.o. intake.  He denies any fevers, cough, chest pain, or shortness of breath.  He has not had any dysuria, hematuria, nausea, vomiting, or abdominal pain.  Daughter at bedside is concerned that his speech seemed slurred earlier today and that he seemed unsteady on his feet.  Patient denies any vision changes, speech changes, numbness, or weakness.        Past Medical History:  Diagnosis Date  . Cardiomyopathy ([place])   . Chronic kidney disease    chronic, stage III  . Coronary artery disease   . Diabetes mellitus without complication ([place])   . Dysrhythmia    hx V-Tach  . Glaucoma   . Hypercholesterolemia   . Hypertension   . 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 AKI (acute kidney injury) ([place]) 12/28/2020  . CKD stage 3 due to type 2 diabetes mellitus ([place]) 12/28/2020  . Type 2 diabetes mellitus with hypoglycemia without coma ([place]) 12/28/2020  . Coronary artery disease   . Hypertension   . Transaminitis     Past Surgical History:  Procedure Laterality Date  . COLONOSCOPY WITH PROPOFOL N/A 06/08/2017   Procedure: COLONOSCOPY WITH PROPOFOL;  Surgeon: Lollie Sails, MD;  Location: [facility] ENDOSCOPY;  Service: Endoscopy;  Laterality: N/A;  . EYE SURGERY Left 08/25/2014   cataract  . EYE SURGERY Right 02/22/2014   lens   . EYE SURGERY Right 02/14/2014   Vitreous Retinal Surgery  . EYE SURGERY Left 02/26/2016   Vitreous Retinal Surgery  . V TACH ABLATION  2014    Prior to Admission medications   Medication Sig Start Date End Date Taking? Authorizing Provider  allopurinol (ZYLOPRIM) 100 MG tablet Take 100 mg daily by mouth.   Yes [provider]  amLODipine (NORVASC) 5 MG tablet Take 1 tablet by mouth daily. 11/29/20  Yes [provider]  aspirin EC 81 MG tablet Take 81 mg daily by mouth.   Yes [provider]  atorvastatin (LIPITOR) 40 MG tablet Take 40 mg at bedtime by mouth.   Yes [provider]  Calcium Carb-Cholecalciferol (CALCIUM 600+D) 600-800 MG-UNIT TABS Take 1 tablet by mouth daily.   Yes [provider]  clopidogrel (PLAVIX) 75 MG tablet Take 75 mg daily by mouth.   Yes [provider]  glimepiride (AMARYL) 1 MG tablet Take 1 mg by mouth daily. 11/21/20  Yes [provider]  IRON-VITAMIN C PO Take 1 tablet daily by mouth.   Yes [provider]  metoprolol succinate (TOPROL-XL) 100 MG 24 hr tablet Take 50 mg by mouth 2 (two) times daily. Take with or immediately following a meal.   Yes [provider]  sildenafil (REVATIO) 20 MG tablet Take 60 mg as needed by mouth.   Yes [provider]  timolol (TIMOPTIC) 0.25 % ophthalmic  solution Place 1 drop daily into the left eye.   Yes [provider]    Allergies Metformin and related  Family History  Problem Relation Age of Onset  . Diabetes Mother     Social History Social History   Tobacco Use  . Smoking status: Former Smoker    Packs/day: 1.00    Years: 12.00    Pack years: 12.00    Types: Cigarettes    Quit date: 09/16/2002    Years since quitting: 18.2  . Smokeless tobacco: Never Used  Vaping Use  . Vaping Use: Never used  Substance Use Topics  . Alcohol use: Yes    Alcohol/week: 6.0 standard drinks    Types: 2 Glasses of wine, 2  Cans of beer, 2 Shots of liquor per week    Comment: last 24hrs none  . Drug use: No    Review of Systems  Constitutional: No fever/chills.  Positive for generalized weakness and fatigue. Eyes: No visual changes. ENT: No sore throat. Cardiovascular: Denies chest pain. Respiratory: Denies shortness of breath. Gastrointestinal: No abdominal pain.  No nausea, no vomiting.  Positive for diarrhea.  No constipation. Genitourinary: Negative for dysuria. Musculoskeletal: Negative for back pain. Skin: Negative for rash. Neurological: Negative for headaches, focal weakness or numbness.  ____________________________________________   PHYSICAL EXAM:  VITAL SIGNS: ED Triage Vitals [12/28/20 1143]  Enc Vitals Group     BP (!) 112/55     Pulse Rate 79     Resp 18     Temp 99.6 F (37.6 C)     Temp Source Oral     SpO2 98 %     Weight 170 lb (77.1 kg)     Height 5\' 8"  (1.727 m)     Head Circumference      Peak Flow      Pain Score 0     Pain Loc      Pain Edu?      Excl. in [place]?     Constitutional: Alert and oriented. Eyes: Conjunctivae are normal. Head: Atraumatic. Nose: No congestion/rhinnorhea. Mouth/Throat: Mucous membranes are moist. Neck: Normal ROM Cardiovascular: Normal rate, regular rhythm. Grossly normal heart sounds.  2+ radial pulses bilaterally. Respiratory: Normal respiratory effort.  No retractions. Lungs CTAB. Gastrointestinal: Soft and nontender. No distention. Genitourinary: deferred Musculoskeletal: No lower extremity tenderness nor edema. Neurologic:  Normal speech and language. No gross focal neurologic deficits are appreciated. Skin:  Skin is warm, dry and intact. No rash noted. Psychiatric: Mood and affect are normal. Speech and behavior are normal.  ____________________________________________   LABS (all labs ordered are listed, but only abnormal results are displayed)  Labs Reviewed  CBC - Abnormal; Notable for the following components:       Result Value   WBC 19.7 (*)    RBC 3.21 (*)    Hemoglobin 9.2 (*)    HCT 26.8 (*)    All other components within normal limits  DIFFERENTIAL - Abnormal; Notable for the following components:   Neutro Abs 16.6 (*)    Lymphs Abs 0.5 (*)    Monocytes Absolute 2.3 (*)    Abs Immature Granulocytes 0.11 (*)    All other components within normal limits  COMPREHENSIVE METABOLIC PANEL - Abnormal; Notable for the following components:   CO2 19 (*)    Glucose, Bld 46 (*)    BUN 79 (*)    Creatinine, Ser 6.16 (*)    Calcium 8.7 (*)    Albumin 3.4 (*)  AST 84 (*)    ALT 56 (*)    GFR, Estimated 9 (*)    All other components within normal limits  RESP PANEL BY RT-PCR (FLU A&B, COVID) ARPGX2  PROTIME-INR  APTT  URINALYSIS, COMPLETE (UACMP) WITH MICROSCOPIC  CK  PROTEIN ELECTRO, RANDOM URINE  PROTEIN ELECTROPHORESIS, SERUM  KAPPA/LAMBDA LIGHT CHAINS  PROTEIN / CREATININE RATIO, URINE  HEPATITIS B CORE ANTIBODY, TOTAL  HEPATITIS B SURFACE ANTIBODY,QUALITATIVE  HEPATITIS B CORE ANTIBODY, IGM  HEPATITIS B SURFACE ANTIGEN  CBG MONITORING, ED   ____________________________________________  EKG  ED ECG REPORT I, Blake Divine, the attending physician, personally viewed and interpreted this ECG.   Date: 12/28/2020  EKG Time: 11:50  Rate: 80  Rhythm: normal sinus rhythm  Axis: Normal  Intervals:none  ST&T Change: None   PROCEDURES  Procedure(s) performed (including Critical Care):  Procedures   ____________________________________________   INITIAL IMPRESSION / ASSESSMENT AND PLAN / ED COURSE      75 year old male with past medical history of hypertension, hyperlipidemia, diabetes, CAD, and CKD who presents to the ED for generalized weakness increasing over the past 7 days associated with some concern for slurred speech and unsteady gait today.  He is alert and oriented on my assessment with no focal neurologic deficits.  EKG shows no evidence of arrhythmia or ischemia.   Labs are remarkable for AKI on top of patient's known chronic kidney disease.  Electrolytes within normal limits and I suspect AKI is due to hypovolemia, will hydrate with IV fluids.  Case discussed with nephrology, who will see patient in consultation.  Low suspicion for acute stroke at this time.  Case discussed with hospitalist for admission.       ____________________________________________   FINAL CLINICAL IMPRESSION(S) / ED DIAGNOSES  Final diagnoses:  AKI (acute kidney injury) ([place])  Generalized weakness     ED Discharge Orders    None       Note:  This document was prepared using Dragon voice recognition software and may include unintentional dictation errors.   Blake Divine, MD 12/28/20 [phone]

## 2020-12-28 NOTE — Consult Note (Signed)
[facility]  CONSULT NOTE    Date: 12/28/2020                  Patient Name:  Nathaniel Nguyen  MRN: [id number]  DOB: 1946/06/23  Age / Sex: 75 y.o., male         PCP: Tracie Harrier, MD                 Service Requesting Consult: [place]                 Reason for Consult: AKI            History of Present Illness: [name] is a 75 y.o.  male with  Past medical history of hypertension, MI, CKD, CAD, and diabetes. He presents to the hospital with weakness and slurred speech. He states his daughter noticed this recommended he come get check out.   Patient states he currently sees a nephrologist frequently. He states he has had a poor appetite for the past 2 weeks or more. He denies nausea and vomiting but has had diarrhea for 2-3 days. Up to 5 episodes a day. Denies use of NSAIDs. Denies shortness of breath. Pertinent labs show a creatinine of 6.16, BUN 79, Calcium 8.7, K 3.6, Na 135 and eGFR 9.  Patient seen laying on stretcher. Currently on room air Normal sinus rhythm on monitor. Denies pain and discomfort   Medications: Outpatient medications: (Not in a hospital admission)   Current medications: Current Facility-Administered Medications  Medication Dose Route Frequency Provider Last Rate Last Admin  . 0.9 %  sodium chloride infusion   Intravenous Continuous Lailoni Baquera, NP      . lactated ringers bolus 1,000 mL  1,000 mL Intravenous Once Blake Divine, MD       Current Outpatient Medications  Medication Sig Dispense Refill  . allopurinol (ZYLOPRIM) 100 MG tablet Take 100 mg daily by mouth.    Marland Kitchen amLODipine (NORVASC) 5 MG tablet Take 1 tablet by mouth daily.    Marland Kitchen aspirin EC 81 MG tablet Take 81 mg daily by mouth.    Marland Kitchen atorvastatin (LIPITOR) 40 MG tablet Take 40 mg at bedtime by mouth.    . Calcium Carb-Cholecalciferol (CALCIUM 600+D) 600-800 MG-UNIT TABS Take 1 tablet by mouth daily.    . clopidogrel (PLAVIX) 75 MG tablet Take 75 mg  daily by mouth.    Marland Kitchen glimepiride (AMARYL) 1 MG tablet Take 1 mg by mouth daily.    Marland Kitchen IRON-VITAMIN C PO Take 1 tablet daily by mouth.    . metoprolol succinate (TOPROL-XL) 100 MG 24 hr tablet Take 50 mg by mouth 2 (two) times daily. Take with or immediately following a meal.    . sildenafil (REVATIO) 20 MG tablet Take 60 mg as needed by mouth.    . timolol (TIMOPTIC) 0.25 % ophthalmic solution Place 1 drop daily into the left eye.        Allergies: Allergies  Allergen Reactions  . Metformin And Related       Past Medical History: Past Medical History:  Diagnosis Date  . Cardiomyopathy ([place])   . Chronic kidney disease    chronic, stage III  . Coronary artery disease   . Diabetes mellitus without complication ([place])   . Dysrhythmia    hx V-Tach  . Glaucoma   . Hypercholesterolemia   . Hypertension   . 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 COLONOSCOPY WITH PROPOFOL N/A 06/08/2017   Procedure: COLONOSCOPY WITH PROPOFOL;  Surgeon: Lollie Sails, MD;  Location: [facility] ENDOSCOPY;  Service: Endoscopy;  Laterality: N/A;  . EYE SURGERY Left 08/25/2014   cataract  . EYE SURGERY Right 02/22/2014   lens  . EYE SURGERY Right 02/14/2014   Vitreous Retinal Surgery  . EYE SURGERY Left 02/26/2016   Vitreous Retinal Surgery  . V TACH ABLATION  2014     Family History: No family history on file.   Social History: Social History   Socioeconomic History  . Marital status: Married    Spouse name: Not on file  . Number of children: Not on file  . Years of education: Not on file  . Highest education level: Not on file  Occupational History  . Not on file  Tobacco Use  . Smoking status: Former Smoker    Packs/day: 1.00    Years: 12.00    Pack years: 12.00    Types: Cigarettes    Quit date: 09/16/2002    Years since quitting: 18.2  . Smokeless tobacco: Never Used  Vaping Use  .  Vaping Use: Never used  Substance and Sexual Activity  . Alcohol use: Yes    Alcohol/week: 6.0 standard drinks    Types: 2 Glasses of wine, 2 Cans of beer, 2 Shots of liquor per week    Comment: last 24hrs none  . Drug use: No  . Sexual activity: Not on file  Other Topics Concern  . Not on file  Social History Narrative  . Not on file   Social Determinants of Health   Financial Resource Strain: Not on file  Food Insecurity: Not on file  Transportation Needs: Not on file  Physical Activity: Not on file  Stress: Not on file  Social Connections: Not on file  Intimate Partner Violence: Not on file     Review of Systems: Review of Systems  Constitutional: Positive for malaise/fatigue.  Gastrointestinal: Positive for diarrhea.  Neurological: Positive for weakness.  All other systems reviewed and are negative.   Vital Signs: Blood pressure (!) 112/55, pulse 79, temperature 99.6 F (37.6 C), temperature source Oral, resp. rate 18, height '5\' 8"'  (1.727 m), weight 77.1 kg, SpO2 98 %.  Weight trends: Filed Weights   12/28/20 1143  Weight: 77.1 kg    Physical Exam: General: NAD, laying on stretcher  Head: Normocephalic, atraumatic. Dry oral mucosal membranes  Eyes: Anicteric  Lungs:  Clear to auscultation, normal breathing effort  Heart: Regular rate and rhythm  Abdomen:  Soft, nontender,   Extremities:  no  peripheral edema.  Neurologic: Nonfocal, moving all four extremities  Skin: No lesions        Lab results: Basic Metabolic Panel: Recent Labs  Lab 12/28/20 1151  NA 135  K 3.6  CL 102  CO2 19*  GLUCOSE 46*  BUN 79*  CREATININE 6.16*  CALCIUM 8.7*    Liver Function Tests: Recent Labs  Lab 12/28/20 1151  AST 84*  ALT 56*  ALKPHOS 83  BILITOT 1.0  PROT 6.8  ALBUMIN 3.4*   No results for input(s): LIPASE, AMYLASE in the last 168 hours. No results for input(s): AMMONIA in the last 168 hours.  CBC: Recent Labs  Lab 12/28/20 1151  WBC 19.7*   NEUTROABS 16.6*  HGB 9.2*  HCT 26.8*  MCV 83.5  PLT 192    Cardiac Enzymes: No results  for input(s): CKTOTAL, CKMB, CKMBINDEX, TROPONINI in the last 168 hours.  BNP: Invalid input(s): POCBNP  CBG: No results for input(s): GLUCAP in the last 168 hours.  Microbiology: Results for orders placed or performed in visit on 06/14/19  Novel Coronavirus, NAA ([facility])     Status: None   Collection Time: 06/14/19  3:13 PM   Specimen: Nasopharyngeal(NP) swabs in vial transport medium   NASOPHARYNGE  TESTING  Result Value Ref Range Status   SARS-CoV-2, NAA Not Detected Not Detected Final    Comment: Testing was performed using the cobas(R) SARS-CoV-2 test. This nucleic acid amplification test was developed and its performance characteristics determined by [employer]. Nucleic acid amplification tests include PCR and TMA. This test has not been FDA cleared or approved. This test has been authorized by FDA under an Emergency Use Authorization (EUA). This test is only authorized for the duration of time the declaration that circumstances exist justifying the authorization of the emergency use of in vitro diagnostic tests for detection of SARS-CoV-2 virus and/or diagnosis of COVID-19 infection under section 564(b)(1) of the Act, 21 U.S.C. 416LAG-5(X) (1), unless the authorization is terminated or revoked sooner. When diagnostic testing is negative, the possibility of a false negative result should be considered in the context of a patient's recent exposures and the presence of clinical signs and symptoms consistent with COVID-19. An individual without symptoms  of COVID-19 and who is not shedding SARS-CoV-2 virus would expect to have a negative (not detected) result in this assay.     Coagulation Studies: Recent Labs    12/28/20 1151  LABPROT 14.9  INR 1.2    Urinalysis: No results for input(s): COLORURINE, LABSPEC, PHURINE, GLUCOSEU, HGBUR, BILIRUBINUR, KETONESUR,  PROTEINUR, UROBILINOGEN, NITRITE, LEUKOCYTESUR in the last 72 hours.  Invalid input(s): APPERANCEUR    Imaging: CT HEAD WO CONTRAST  Result Date: 12/28/2020 CLINICAL DATA:  Aphasia and gait imbalance beginning Thursday EXAM: CT HEAD WITHOUT CONTRAST TECHNIQUE: Contiguous axial images were obtained from the base of the skull through the vertex without intravenous contrast. COMPARISON:  03/20/2020 FINDINGS: Brain: No evidence of acute infarction, hemorrhage, hydrocephalus, extra-axial collection or mass lesion/mass effect. Periventricular white matter hypodensity is a nonspecific finding, but most commonly relates to chronic ischemic small vessel disease. 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 Vascular: No hyperdense vessel or unexpected calcification. Skull: Normal. Negative for fracture or focal lesion. Sinuses/Orbits: No acute finding. Other: None. IMPRESSION: No acute intracranial abnormality Electronically Signed   By: Miachel Roux M.D.   On: 12/28/2020 12:49      Assessment & Plan: Mr. Sevan Mcbroom is a 75 y.o.  male with with  Past medical history of hypertension, MI, CKD, CAD, and diabetes. He presents to the hospital with weakness and slurred speech. He states his daughter noticed this recommended he come get check out.   1. 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  Acute kidney injury likely secondary to prerenal azotemia Based on recent history of poor appetite and diarrhea Renal ultrasound ordered to evaluate obstruction No IV contrast exposure No acute need for dialysis at this time 1L LR bolus ordered in ED IVF at [facility] @ 81m/hr Will monitor renal function   2. 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   Most recent hemoglobin A1c is 6.4 on 09/07/20.  Hypoglycemic in ED to 46. Treated with graham crackers and ginger ale.   3.  Hypertension Home regimen includes  Amlodipine and metoprolol. Currently 112/55 Will monitor    LOS: 0 Donnell Wion 6/3/20222:14 PM

## 2020-12-28 NOTE — ED Notes (Signed)
Pt denies any difficulty swallowing. Pt currently eating from dinner tray.

## 2020-12-28 NOTE — ED Notes (Signed)
Patient denies pain and is resting comfortably. Denies nausea. Visitors at bedside. Stretcher locked low. Both rails up per pt request. Call bell within reach.

## 2020-12-28 NOTE — ED Notes (Signed)
IV team at bedside 

## 2020-12-28 NOTE — ED Notes (Signed)
Report given to next shift RN.

## 2020-12-28 NOTE — H&P (Signed)
History and Physical    Nathaniel Nguyen EXH:[id number] DOB: Jul 11, 1946 DOA: 12/28/2020  PCP: Tracie Harrier, MD   Patient coming from: Home  I have personally briefly reviewed patient's old medical records in [place]  Chief Complaint: Unsteady gait/slurred speech  HPI: Nathaniel Nguyen is a 75 y.o. 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 His daughter states that he has been weak for about 4 days and he noted that his gait is unsteady but he has not had any falls.  Yesterday while talking to him on the phone she felt his speech was slurred and she asked one of her sisters to go stay with him.  They also state the patient oral intake has been very poor and that he appears very weak. Patient denies having any falls, and denies feeling dizzy or lightheaded. He denies having any nausea, no vomiting, no abdominal pain, no changes in his bowel habits, no headache, no fever, no chills, no cough, no focal deficits, no blurred vision, no headache. Labs show sodium 135, potassium 3.6, chloride 102, bicarb 19, glucose 46, BUN 79 compared to baseline of 49, creatinine 6.16 compared to baseline of 2.74 from April, 2022, calcium 8.7, alkaline phosphatase 83, albumin 3.4, AST 84, ALT 56, total protein 6.8, white count 19.7, hemoglobin 9.2, hematocrit 26.8, MCV 83.5, RDW 15.5, platelet count 192, PT 14.9, INR 1.2 CT scan of the head without contrast shows no acute intracranial abnormality Twelve-lead EKG reviewed by me shows normal sinus rhythm with a left axis deviation.   ED Course: Patient is a 75 year old male who was brought into the ER by his daughters for evaluation of unsteady gait as well as slurred speech. Labs show worsening of his renal function from baseline,  in April 2022, patient had a serum creatinine of 2.74 and a BUN of 46 and on admission today his serum creatinine is 6.16 and BUN is elevated at 79. He also has a leukocytosis of 19K with no evidence of an infectious process at this time. He will be admitted to the hospital for further evaluation.    Review of Systems: As per HPI otherwise all other systems reviewed and negative.    Past Medical History:  Diagnosis Date  . Cardiomyopathy ([place])   . Chronic kidney disease    chronic, stage III  . Coronary artery disease   . Diabetes mellitus without complication ([place])   . Dysrhythmia    hx V-Tach  . Glaucoma   . Hypercholesterolemia   . Hypertension   . 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 COLONOSCOPY WITH PROPOFOL N/A 06/08/2017   Procedure: COLONOSCOPY WITH PROPOFOL;  Surgeon: Lollie Sails, MD;  Location: [facility] ENDOSCOPY;  Service: Endoscopy;  Laterality: N/A;  . EYE SURGERY Left 08/25/2014   cataract  . EYE SURGERY Right 02/22/2014   lens  . EYE SURGERY Right 02/14/2014   Vitreous Retinal Surgery  . EYE SURGERY Left 02/26/2016   Vitreous Retinal Surgery  . V TACH ABLATION  2014     reports that he quit smoking about 18 years ago. His smoking use included cigarettes. He has a 12.00 pack-year smoking history. He has never used smokeless tobacco. He reports current alcohol use of about 6.0  standard drinks of alcohol per week. He reports that he does not use drugs.  Allergies  Allergen Reactions  . Metformin And Related     Family History  Problem Relation Age of Onset  . Diabetes Mother       Prior to Admission medications   Medication Sig Start Date End Date Taking? Authorizing Provider  allopurinol (ZYLOPRIM) 100 MG tablet Take 100 mg daily by mouth.   Yes [provider]  amLODipine (NORVASC) 5 MG tablet Take 1 tablet by mouth daily. 11/29/20  Yes [provider]  aspirin EC  81 MG tablet Take 81 mg daily by mouth.   Yes [provider]  atorvastatin (LIPITOR) 40 MG tablet Take 40 mg at bedtime by mouth.   Yes [provider]  Calcium Carb-Cholecalciferol (CALCIUM 600+D) 600-800 MG-UNIT TABS Take 1 tablet by mouth daily.   Yes [provider]  clopidogrel (PLAVIX) 75 MG tablet Take 75 mg daily by mouth.   Yes [provider]  glimepiride (AMARYL) 1 MG tablet Take 1 mg by mouth daily. 11/21/20  Yes [provider]  IRON-VITAMIN C PO Take 1 tablet daily by mouth.   Yes [provider]  metoprolol succinate (TOPROL-XL) 100 MG 24 hr tablet Take 50 mg by mouth 2 (two) times daily. Take with or immediately following a meal.   Yes [provider]  sildenafil (REVATIO) 20 MG tablet Take 60 mg as needed by mouth.   Yes [provider]  timolol (TIMOPTIC) 0.25 % ophthalmic solution Place 1 drop daily into the left eye.   Yes [provider]    Physical Exam: Vitals:   12/28/20 1143 12/28/20 1330 12/28/20 1430  BP: (!) 112/55 112/64   Pulse: 79 81 72  Resp: 18 (!) 23 18  Temp: 99.6 F (37.6 C)    TempSrc: Oral    SpO2: 98% 98% 97%  Weight: 77.1 kg    Height: 5\' 8"  (1.727 m)       Vitals:   12/28/20 1143 12/28/20 1330 12/28/20 1430  BP: (!) 112/55 112/64   Pulse: 79 81 72  Resp: 18 (!) 23 18  Temp: 99.6 F (37.6 C)    TempSrc: Oral    SpO2: 98% 98% 97%  Weight: 77.1 kg    Height: 5\' 8"  (1.727 m)        Constitutional: Alert and oriented x 3. Not in any apparent distress HEENT:      Head: Normocephalic and atraumatic.         Eyes: PERLA, EOMI, Conjunctivae are normal. Sclera is non-icteric.       Mouth/Throat: Mucous membranes are moist.       Neck: Supple with no signs of meningismus. Cardiovascular: Regular rate and rhythm. No murmurs, gallops, or rubs. 2+ symmetrical distal pulses are present . No JVD. No LE edema Respiratory: Respiratory effort normal .Lungs sounds  clear bilaterally. No wheezes, crackles, or rhonchi.  Gastrointestinal: Soft, non tender, and non distended with positive bowel sounds.  Genitourinary: No CVA tenderness. Musculoskeletal: Nontender with normal range of motion in all extremities. No cyanosis, or erythema of extremities. Neurologic:  Face is symmetric. Moving all extremities. No gross focal neurologic deficits  Skin: Skin is warm, dry.  No rash or ulcers Psychiatric: Mood and affect are normal   Labs on Admission: I have personally reviewed following labs and imaging studies  CBC: Recent Labs  Lab 12/28/20 1151  WBC 19.7*  NEUTROABS 16.6*  HGB 9.2*  HCT 26.8*  MCV 83.5  PLT 833   Basic Metabolic Panel: Recent Labs  Lab 12/28/20 1151  NA 135  K 3.6  CL 102  CO2 19*  GLUCOSE 46*  BUN 79*  CREATININE 6.16*  CALCIUM 8.7*   GFR: Estimated Creatinine Clearance: 9.9 mL/min (A) (by C-G formula based on SCr of 6.16 mg/dL (H)). Liver Function Tests: Recent Labs  Lab 12/28/20 1151  AST 84*  ALT 56*  ALKPHOS 83  BILITOT 1.0  PROT 6.8  ALBUMIN 3.4*   No results for input(s): LIPASE, AMYLASE in the last 168 hours. No results for input(s): AMMONIA in the last 168 hours. Coagulation Profile: Recent Labs  Lab 12/28/20 1151  INR 1.2   Cardiac Enzymes: No results for input(s): CKTOTAL, CKMB, CKMBINDEX, TROPONINI in the last 168 hours. BNP (last 3 results) No results for input(s): PROBNP in the last 8760 hours. HbA1C: No results for input(s): HGBA1C in the last 72 hours. CBG: No results for input(s): GLUCAP in the last 168 hours. Lipid Profile: No results for input(s): CHOL, HDL, LDLCALC, TRIG, CHOLHDL, LDLDIRECT in the last 72 hours. Thyroid Function Tests: No results for input(s): TSH, T4TOTAL, FREET4, T3FREE, THYROIDAB in the last 72 hours. Anemia Panel: No results for input(s): VITAMINB12, FOLATE, FERRITIN, TIBC, IRON, RETICCTPCT in the last 72 hours. Urine analysis:    Component Value Date/Time    COLORURINE Yellow 07/29/2013 0755   APPEARANCEUR Hazy 07/29/2013 0755   LABSPEC 1.013 07/29/2013 0755   PHURINE 5.0 07/29/2013 0755   GLUCOSEU 50 mg/dL 07/29/2013 0755   HGBUR 1+ 07/29/2013 0755   BILIRUBINUR Negative 07/29/2013 0755   KETONESUR Negative 07/29/2013 0755   PROTEINUR >=500 07/29/2013 0755   NITRITE Negative 07/29/2013 0755   LEUKOCYTESUR Negative 07/29/2013 0755    Radiological Exams on Admission: CT HEAD WO CONTRAST  Result Date: 12/28/2020 CLINICAL DATA:  Aphasia and gait imbalance beginning Thursday EXAM: CT HEAD WITHOUT CONTRAST TECHNIQUE: Contiguous axial images were obtained from the base of the skull through the vertex without intravenous contrast. COMPARISON:  03/20/2020 FINDINGS: Brain: No evidence of acute infarction, hemorrhage, hydrocephalus, extra-axial collection or mass lesion/mass effect. Periventricular white matter hypodensity is a nonspecific finding, but most commonly relates to chronic ischemic small vessel disease. 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 Vascular: No hyperdense vessel or unexpected calcification. Skull: Normal. Negative for fracture or focal lesion. Sinuses/Orbits: No acute finding. Other: None. IMPRESSION: No acute intracranial abnormality Electronically Signed   By: Miachel Roux M.D.   On: 12/28/2020 12:49     Assessment/Plan Principal Problem:   AKI (acute kidney injury) ([place]) Active Problems:   CKD stage 3 due to type 2 diabetes mellitus ([place])   Type 2 diabetes mellitus with hypoglycemia without coma ([place])   Coronary artery disease   Hypertension   Transaminitis    Acute kidney injury. Unclear etiology  ??  Postobstructive versus prerenal from poor oral intake At baseline patient has a serum creatinine of 2.74 on admission today serum creatinine is 6.16, BUN is also elevated at 79 compared to baseline of 49 We will place patient on a bicarbonate infusion Follow-up results of renal ultrasound Obtain CK  levels Avoid nephrotoxic agents We will request nephrology consult     Diabetes mellitus with complications of stage III chronic kidney disease and hypoglycemia Patient has a history of diabetes mellitus and is on oral hypoglycemic agents Blood sugar upon arrival to the ER was 49 Hold glimepiride Maintain consistent carbohydrate diet Blood sugar  checks every 4 hours     Hypertension Patient is normotensive Hold amlodipine Continue metoprolol    History of coronary artery disease Continue aspirin, Plavix, beta-blocker and statins     Transaminitis Most likely secondary to statin use We will monitor liver enzymes      DVT prophylaxis: Heparin Code Status: full code Family Communication: Greater than 50% of time was spent discussing patient's condition and plan of care with him and his daughters at the bedside.  All questions and concerns have been addressed.  They verbalized understanding and agree with the plan.  CODE STATUS was discussed and patient is a full code Disposition Plan: Back to previous home environment Consults called: Nephrology Status: At the time of admission, it appears that the appropriate admission status for this patient is inpatient. This is judged to be reasonable and necessary in order to provide the required intensity of service to ensure the patient's safety given the presenting symptoms, physical exam findings, and initial radiographic and laboratory data in the context of their comorbid conditions. Patient requires inpatient status due to high intensity of service, high risk for further deterioration and high frequency of surveillance required.    Collier Bullock MD [facility]     12/28/2020, 3:01 PM

## 2020-12-28 NOTE — ED Notes (Signed)
Two unsuccessful IV attempts made by this RN. Dr. Charna Archer aware.

## 2020-12-28 NOTE — ED Notes (Signed)
EDP Smith at bedside with [place] to obtain IV access.

## 2020-12-28 NOTE — ED Notes (Signed)
Compatibility checked between sodium bicarb and LR by Manuela Schwartz, Pharmacist.

## 2020-12-28 NOTE — ED Notes (Signed)
Pt reminded urine sample needed when he feels able. Call bell within reach. Stretcher locked low. Rails up. Pt watching tv while eating.

## 2020-12-28 NOTE — ED Notes (Signed)
IV team remains at bedside 

## 2020-12-28 NOTE — ED Notes (Signed)
Patient given graham crackers and ginger ale per Dr. Charna Archer for blood sugar of 46. Family is going to go get the patient something to eat. Patient states he hasn't eaten since last night, but took his diabetes meds this morning. Patient was advised to eat when taking those meds. Patient is alert and oriented x4.  Patient has a soft voice, but has clear speech and answers questions appropriately. Patient moves all extremities equally.

## 2020-12-28 NOTE — ED Notes (Signed)
IV RN could not obtain access. Dr. Francine Graven aware and requested ED MD to try. Dr. Charlsie Quest agreed. Ultrasound machine at bedside. Ultrasound tech at bedside for renal exam.

## 2020-12-29 ENCOUNTER — Inpatient Hospital Stay: Payer: Medicare PPO

## 2020-12-29 LAB — CK: Total CK: 84 U/L (ref 49–397)

## 2020-12-29 LAB — GLUCOSE, CAPILLARY
Glucose-Capillary: 113 mg/dL — ABNORMAL HIGH (ref 70–99)
Glucose-Capillary: 115 mg/dL — ABNORMAL HIGH (ref 70–99)
Glucose-Capillary: 128 mg/dL — ABNORMAL HIGH (ref 70–99)
Glucose-Capillary: 139 mg/dL — ABNORMAL HIGH (ref 70–99)
Glucose-Capillary: 140 mg/dL — ABNORMAL HIGH (ref 70–99)
Glucose-Capillary: 156 mg/dL — ABNORMAL HIGH (ref 70–99)
Glucose-Capillary: 159 mg/dL — ABNORMAL HIGH (ref 70–99)
Glucose-Capillary: 49 mg/dL — ABNORMAL LOW (ref 70–99)
Glucose-Capillary: 67 mg/dL — ABNORMAL LOW (ref 70–99)
Glucose-Capillary: 91 mg/dL (ref 70–99)

## 2020-12-29 LAB — BASIC METABOLIC PANEL
Anion gap: 10 (ref 5–15)
BUN: 83 mg/dL — ABNORMAL HIGH (ref 8–23)
CO2: 23 mmol/L (ref 22–32)
Calcium: 8.2 mg/dL — ABNORMAL LOW (ref 8.9–10.3)
Chloride: 101 mmol/L (ref 98–111)
Creatinine, Ser: 6.43 mg/dL — ABNORMAL HIGH (ref 0.61–1.24)
GFR, Estimated: 8 mL/min — ABNORMAL LOW (ref >60)
Glucose, Bld: 72 mg/dL (ref 70–99)
Potassium: 3 mmol/L — ABNORMAL LOW (ref 3.5–5.1)
Sodium: 134 mmol/L — ABNORMAL LOW (ref 135–145)

## 2020-12-29 LAB — CBC
HCT: 24.2 % — ABNORMAL LOW (ref 39.0–52.0)
Hemoglobin: 8.6 g/dL — ABNORMAL LOW (ref 13.0–17.0)
MCH: 28.4 pg (ref 26.0–34.0)
MCHC: 35.5 g/dL (ref 30.0–36.0)
MCV: 79.9 fL — ABNORMAL LOW (ref 80.0–100.0)
Platelets: 168 10*3/uL (ref 150–400)
RBC: 3.03 MIL/uL — ABNORMAL LOW (ref 4.22–5.81)
RDW: 13.9 % (ref 11.5–15.5)
WBC: 14.8 10*3/uL — ABNORMAL HIGH (ref 4.0–10.5)
nRBC: 0 % (ref 0.0–0.2)

## 2020-12-29 NOTE — Plan of Care (Signed)

## 2020-12-29 NOTE — Progress Notes (Signed)
This morning CBG was 49 at 0818. Pt given 45g carbohydrates. Rechecked at 0847 and CBG had increased to 67. Recheck at 1020 showed CBG 115. Will continue to monitor q4h per order.

## 2020-12-29 NOTE — Progress Notes (Signed)
Patient presented with elevated temp, tylenol given.  Will continue to monitor and increase vitals    12/29/20 0342  Assess: MEWS Score  Temp (!) 101.6 F (38.7 C)  BP (!) 103/54  Pulse Rate 86  Resp 17  Level of Consciousness Alert  SpO2 97 %  O2 Device Room Air  Assess: MEWS Score  MEWS Temp 2  MEWS Systolic 0  MEWS Pulse 0  MEWS RR 0  MEWS LOC 0  MEWS Score 2  MEWS Score Color Yellow  Assess: if the MEWS score is Yellow or Red  Were vital signs taken at a resting state? Yes  Focused Assessment Change from prior assessment (see assessment flowsheet)  Early Detection of Sepsis Score *See Row Information* High  MEWS guidelines implemented *See Row Information* Yes  Treat  MEWS Interventions Administered prn meds/treatments;Escalated (See documentation below)  Pain Scale 0-10  Pain Score 0  Take Vital Signs  Increase Vital Sign Frequency  Yellow: Q 2hr X 2 then Q 4hr X 2, if remains yellow, continue Q 4hrs  Escalate  MEWS: Escalate Yellow: discuss with charge nurse/RN and consider discussing with provider and RRT  Notify: Charge Nurse/RN  Name of Charge Nurse/RN Notified Melissa RN  Date Charge Nurse/RN Notified 12/29/20  Time Charge Nurse/RN Notified 0400  Document  Patient Outcome Stabilized after interventions  Progress note created (see row info) Yes

## 2020-12-29 NOTE — Progress Notes (Signed)
[facility]  ROUNDING NOTE   Subjective:  [name] is a 75 y.o.  male with  Past medical history of hypertension, MI, CKD, CAD, and diabetes. He presents to the hospital with weakness and slurred speech. He states his daughter noticed this recommended he come get check out.   Patient seen resting in bed Alert and oriented Daughter at bedside States patient lives with her Continues to have poor appetite, but related to patient not liking hospital food.   Denies nausea and vomiting  Objective:  Vital signs in last 24 hours:  Temp:  [97.8 F (36.6 C)-102.2 F (39 C)] 98.5 F (36.9 C) (06/04 1220) Pulse Rate:  [69-87] 71 (06/04 1220) Resp:  [13-23] 18 (06/04 1220) BP: (102-134)/(54-78) 116/57 (06/04 1220) SpO2:  [96 %-99 %] 99 % (06/04 1220)  Weight change:  Filed Weights   12/28/20 1143  Weight: 77.1 kg    Intake/Output: I/O last 3 completed shifts: In: -  Out: 200 [Urine:200]   Intake/Output this shift:  Total I/O In: 1402.4 [I.V.:1402.4] Out: -   Physical Exam: General: NAD, resting in bed  Head: Normocephalic, atraumatic. Moist oral mucosal membranes  Eyes: Anicteric  Lungs:  Clear to auscultation, normal effort  Heart: Regular rate and rhythm  Abdomen:  Soft, nontender  Extremities:  no peripheral edema.  Neurologic: Alert, moving all four extremities  Skin: No lesions    Basic Metabolic Panel: Recent Labs  Lab 12/28/20 1151 12/29/20 0533  NA 135 134*  K 3.6 3.0*  CL 102 101  CO2 19* 23  GLUCOSE 46* 72  BUN 79* 83*  CREATININE 6.16* 6.43*  CALCIUM 8.7* 8.2*    Liver Function Tests: Recent Labs  Lab 12/28/20 1151  AST 84*  ALT 56*  ALKPHOS 83  BILITOT 1.0  PROT 6.8  ALBUMIN 3.4*   No results for input(s): LIPASE, AMYLASE in the last 168 hours. No results for input(s): AMMONIA in the last 168 hours.  CBC: Recent Labs  Lab 12/28/20 1151 12/29/20 0533  WBC 19.7* 14.8*  NEUTROABS 16.6*  --   HGB 9.2* 8.6*   HCT 26.8* 24.2*  MCV 83.5 79.9*  PLT 192 168    Cardiac Enzymes: Recent Labs  Lab 12/29/20 0533  CKTOTAL 84    BNP: Invalid input(s): POCBNP  CBG: Recent Labs  Lab 12/29/20 0345 12/29/20 0818 12/29/20 0847 12/29/20 1020 12/29/20 1220  GLUCAP 91 49* 67* 115* 140*    Microbiology: Results for orders placed or performed during the hospital encounter of 12/28/20  Resp Panel by RT-PCR (Flu A&B, Covid) Nasopharyngeal Swab     Status: None   Collection Time: 12/28/20  2:40 PM   Specimen: Nasopharyngeal Swab; Nasopharyngeal(NP) swabs in vial transport medium  Result Value Ref Range Status   SARS Coronavirus 2 by RT PCR NEGATIVE NEGATIVE Final    Comment: (NOTE) SARS-CoV-2 target nucleic acids are NOT DETECTED.  The SARS-CoV-2 RNA is generally detectable in upper respiratory specimens during the acute phase of infection. The lowest concentration of SARS-CoV-2 viral copies this assay can detect is 138 copies/mL. A negative result does not preclude SARS-Cov-2 infection and should not be used as the sole basis for treatment or other patient management decisions. A negative result may occur with  improper specimen collection/handling, submission of specimen other than nasopharyngeal swab, presence of viral mutation(s) within the areas targeted by this assay, and inadequate number of viral copies(<138 copies/mL). A negative result must be combined with clinical observations, patient  history, and epidemiological information. The expected result is Negative.  Fact Sheet for Patients:  EntrepreneurPulse.com.au  Fact Sheet for Healthcare Providers:  IncredibleEmployment.be  This test is no t yet approved or cleared by the [place] FDA and  has been authorized for detection and/or diagnosis of SARS-CoV-2 by FDA under an Emergency Use Authorization (EUA). This EUA will remain  in effect (meaning this test can be used) for the duration of  the COVID-19 declaration under Section 564(b)(1) of the Act, 21 U.S.C.section 360bbb-3(b)(1), unless the authorization is terminated  or revoked sooner.       Influenza A by PCR NEGATIVE NEGATIVE Final   Influenza B by PCR NEGATIVE NEGATIVE Final    Comment: (NOTE) The Xpert Xpress SARS-CoV-2/FLU/RSV plus assay is intended as an aid in the diagnosis of influenza from Nasopharyngeal swab specimens and should not be used as a sole basis for treatment. Nasal washings and aspirates are unacceptable for Xpert Xpress SARS-CoV-2/FLU/RSV testing.  Fact Sheet for Patients: EntrepreneurPulse.com.au  Fact Sheet for Healthcare Providers: IncredibleEmployment.be  This test is not yet approved or cleared by the [place] FDA and has been authorized for detection and/or diagnosis of SARS-CoV-2 by FDA under an Emergency Use Authorization (EUA). This EUA will remain in effect (meaning this test can be used) for the duration of the COVID-19 declaration under Section 564(b)(1) of the Act, 21 U.S.C. section 360bbb-3(b)(1), unless the authorization is terminated or revoked.  Performed at [facility], [address] [place], [place] [address]     Coagulation Studies: Recent Labs    12/28/20 1151  LABPROT 14.9  INR 1.2    Urinalysis: No results for input(s): COLORURINE, LABSPEC, PHURINE, GLUCOSEU, HGBUR, BILIRUBINUR, KETONESUR, PROTEINUR, UROBILINOGEN, NITRITE, LEUKOCYTESUR in the last 72 hours.  Invalid input(s): APPERANCEUR    Imaging: DG Chest 2 View  Result Date: 12/29/2020 CLINICAL DATA:  Cough.  History of cardiomyopathy EXAM: CHEST - 2 VIEW COMPARISON:  March 01, 2013 FINDINGS: There is slight scarring in the right base. No edema or airspace opacity. Heart is borderline prominent with pulmonary vascularity normal. No adenopathy. No bone lesions. IMPRESSION: Slight scarring right base. No edema or airspace opacity. Borderline  cardiac prominence. Electronically Signed   By: Lowella Grip III M.D.   On: 12/29/2020 10:38   CT HEAD WO CONTRAST  Result Date: 12/28/2020 CLINICAL DATA:  Aphasia and gait imbalance beginning Thursday EXAM: CT HEAD WITHOUT CONTRAST TECHNIQUE: Contiguous axial images were obtained from the base of the skull through the vertex without intravenous contrast. COMPARISON:  03/20/2020 FINDINGS: Brain: No evidence of acute infarction, hemorrhage, hydrocephalus, extra-axial collection or mass lesion/mass effect. Periventricular white matter hypodensity is a nonspecific finding, but most commonly relates to chronic ischemic small vessel disease. 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 Vascular: No hyperdense vessel or unexpected calcification. Skull: Normal. Negative for fracture or focal lesion. Sinuses/Orbits: No acute finding. Other: None. IMPRESSION: No acute intracranial abnormality Electronically Signed   By: Miachel Roux M.D.   On: 12/28/2020 12:49   MR BRAIN WO CONTRAST  Result Date: 12/28/2020 CLINICAL DATA:  Aphasia and gait disturbance beginning yesterday. EXAM: MRI HEAD WITHOUT CONTRAST TECHNIQUE: Multiplanar, multiecho pulse sequences of the brain and surrounding structures were obtained without intravenous contrast. COMPARISON:  Head CT earlier same day FINDINGS: Brain: Diffusion imaging does not show any acute or subacute infarction. No focal abnormality affects the brainstem or cerebellum. Cerebral hemispheres show mild age related volume loss. Old small vessel infarctions of the  thalami, basal ganglia and hemispheric white matter. No sign of cortical or large vessel territory infarction. No mass lesion, hemorrhage, hydrocephalus or extra-axial collection. Vascular: Major vessels at the base of the brain show flow. Skull and upper cervical spine: Negative Sinuses/Orbits: Clear/normal Other: None IMPRESSION: No acute or subacute finding. Mild age related volume loss. Chronic small-vessel  ischemic changes affecting the brain as outlined above. Electronically Signed   By: Nelson Chimes M.D.   On: 12/28/2020 19:53   US RENAL  Result Date: 12/28/2020 CLINICAL DATA:  Acute kidney injury EXAM: RENAL / URINARY TRACT ULTRASOUND COMPLETE COMPARISON:  Renal ultrasound February 09, 2008. FINDINGS: Right Kidney: Renal measurements: 11.7 x 4.9 x 4.8 cm = volume: 133.7 mL. Echogenicity is increased. Renal cortical thickness is normal. No mass, perinephric fluid, or hydronephrosis visualized. No sonographically demonstrable calculus or ureterectasis. Left Kidney: Renal measurements: 11.7 x 5.5 x 5.0 cm = volume: 168.3 mL. Echogenicity is increased. Renal cortical thickness is normal. No perinephric fluid or hydronephrosis visualized. There is a cyst in the upper pole left kidney measuring 1.2 x 1.3 x 1.5 cm. No sonographically demonstrable calculus or ureterectasis. Bladder: Appears normal for degree of bladder distention. Other: Gallbladder wall is thickened with pericholecystic fluid. IMPRESSION: 1. Apparent thickening of the gallbladder wall with pericholecystic fluid. No gallstones appreciable. Concern for acalculus cholecystitis. 2. Increased renal echogenicity, a finding that may be seen with medical renal disease. No obstructing focus in either kidney. Thickness normal bilaterally. 3.  Cyst upper pole left kidney measuring 1.2 x 1.3 x 1.2 cm. Electronically Signed   By: Lowella Grip III M.D.   On: 12/28/2020 16:41     Medications:   . sodium chloride    .  sodium bicarbonate (isotonic) infusion in sterile water 100 mL/hr at 12/29/20 1302   . allopurinol  100 mg Oral Daily  . ferrous sulfate  325 mg Oral Q breakfast   And  . vitamin C  250 mg Oral Q breakfast  . aspirin EC  81 mg Oral Daily  . atorvastatin  40 mg Oral QHS  . calcium-vitamin D  1 tablet Oral Q breakfast  . clopidogrel  75 mg Oral Daily  . heparin  5,000 Units Subcutaneous Q8H  . metoprolol succinate  50 mg Oral BID  .  timolol  1 drop Left Eye Daily   acetaminophen **OR** acetaminophen, ondansetron **OR** ondansetron (ZOFRAN) IV  Assessment/ Plan:  Nathaniel Nguyen is a 75 y.o.  male with past medical history of hypertension, MI, CKD, CAD, and diabetes. He presents to the hospital with weakness and slurred speech. He states his daughter noticed this recommended he come get check out.   1. 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  Acute kidney injury likely secondary to prerenal azotemia Based on recent history of poor appetite and diarrhea vs hypotension Renal ultrasound negative for obstruction No IV contrast exposure No acute need for dialysis at this time Renal function continues to decline IVF-Sodium Bicarb @100ml /hr Chest Xray negative  Urinalysis ordered Changed diet to regular to encourage nutrition Will monitor renal function.   Lab Results  Component Value Date   CREATININE 6.43 (H) 12/29/2020   CREATININE 6.16 (H) 12/28/2020   CREATININE 2.09 (H) 07/30/2013    Intake/Output Summary (Last 24 hours) at 12/29/2020 1321 Last data filed at 12/29/2020 1302 Gross per 24 hour  Intake 1402.35 ml  Output 200 ml  Net  1202.35 ml   2. 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   Most recent hemoglobin A1c is 6.4 on 09/07/20.  Hypoglycemic in ED to 49.  Currently stable   3. Hypertension: baseline 140s/60s Home regimen includes Amlodipine and metoprolol. Currently 116/57 Will continue IVF May consider decrease in medications     LOS: 1 [place] 6/4/20221:21 PM

## 2020-12-29 NOTE — Progress Notes (Signed)
PROGRESS NOTE  Nathaniel Nguyen QMV:[id number] DOB: February 23, 1946 DOA: 12/28/2020 PCP: Tracie Harrier, MD  HPI/Recap of past 24 hours: Patient seen and examined at bedside his daughter Levada Dy was at bedside.  He denies any new complain.  Assessment/Plan: Principal Problem:   AKI (acute kidney injury) ([place]) Active Problems:   CKD stage 3 due to type 2 diabetes mellitus ([place])   Type 2 diabetes mellitus with hypoglycemia without coma ([facility])   Coronary artery disease   Hypertension   Transaminitis   1.  Acute kidney injury on chronic kidney disease stage IV.  This is due to prerenal azotemia secondary to poor appetite and diarrhea.  His current GFR is 22.  Nephrology was consulted they advised to avoid IV contrast exposure they do not think he needs dialysis at this time advised to continue IV hydration and to monitor renal function..  Obtain renal ultrasound  2.  Type 2 diabetes mellitus.  Contributing to chronic kidney disease.  He was actually well controlled and he was hypoglycemic on arrival in the ED with blood sugar of 46.  This is secondary to his poor p.o. intake  3.  Hypertension well-controlled.  Continue home remedy of bladder pain and metoprolol..  4.  Coronary artery disease: Stable.  Continue aspirin and atorvastatin  Code Status: Full  Severity of Illness: The appropriate patient status for this patient is INPATIENT. Inpatient status is judged to be reasonable and necessary in order to provide the required intensity of service to ensure the patient's safety. The patient's presenting symptoms, physical exam findings, and initial radiographic and laboratory data in the context of their chronic comorbidities is felt to place them at high risk for further clinical deterioration. Furthermore, it is not anticipated that the patient will be medically stable for discharge from the hospital within 2 midnights of admission. The following factors support the patient status of inpatient.    " The patient's presenting symptoms include acute kidney injury and fever. " The worrisome physical exam findings include  " The initial radiographic and laboratory data are worrisome because of fever " The chronic co-morbidities include diabetes and hypertension   * I certify that at the point of admission it is my clinical judgment that the patient will require inpatient hospital care spanning beyond 2 midnights from the point of admission due to high intensity of service, high risk for further deterioration and high frequency of surveillance required.*    Family Communication: Daughter at bedside [place]: Home when stable   Consultants:  Nephrology  Procedures:  None  Antimicrobials:  None  DVT prophylaxis: Heparin   Objective: Vitals:   12/29/20 0341 12/29/20 0342 12/29/20 0603 12/29/20 0817  BP: (!) 103/54 (!) 103/54 (!) 103/55 (!) 107/58  Pulse: 87 86 74 69  Resp: 18 17 16 18   Temp: (!) 102.2 F (39 C) (!) 101.6 F (38.7 C) 98.3 F (36.8 C) 98.3 F (36.8 C)  TempSrc: Oral Oral  Oral  SpO2: 98% 97% 98% 98%  Weight:      Height:        Intake/Output Summary (Last 24 hours) at 12/29/2020 1031 Last data filed at 12/29/2020 0340 Gross per 24 hour  Intake --  Output 200 ml  Net -200 ml   Filed Weights   12/28/20 1143  Weight: 77.1 kg   Body mass index is 25.85 kg/m.  Exam:  . General: 75 y.o. year-old male well developed well nourished in no acute distress.  Alert and  oriented x3. . Cardiovascular: Regular rate and rhythm with no rubs or gallops.  No thyromegaly or JVD noted.   Marland Kitchen Respiratory: Clear to auscultation with no wheezes or rales. Good inspiratory effort. . Abdomen: Soft nontender nondistended with normal bowel sounds x4 quadrants. . Musculoskeletal: No lower extremity edema. 2/4 pulses in all 4 extremities. . Skin: No ulcerative lesions noted or rashes, . Psychiatry: Mood is appropriate for condition and  setting    Data Reviewed: CBC: Recent Labs  Lab 12/28/20 1151 12/29/20 0533  WBC 19.7* 14.8*  NEUTROABS 16.6*  --   HGB 9.2* 8.6*  HCT 26.8* 24.2*  MCV 83.5 79.9*  PLT 192 161   Basic Metabolic Panel: Recent Labs  Lab 12/28/20 1151 12/29/20 0533  NA 135 134*  K 3.6 3.0*  CL 102 101  CO2 19* 23  GLUCOSE 46* 72  BUN 79* 83*  CREATININE 6.16* 6.43*  CALCIUM 8.7* 8.2*   GFR: Estimated Creatinine Clearance: 9.5 mL/min (A) (by C-G formula based on SCr of 6.43 mg/dL (H)). Liver Function Tests: Recent Labs  Lab 12/28/20 1151  AST 84*  ALT 56*  ALKPHOS 83  BILITOT 1.0  PROT 6.8  ALBUMIN 3.4*   No results for input(s): LIPASE, AMYLASE in the last 168 hours. No results for input(s): AMMONIA in the last 168 hours. Coagulation Profile: Recent Labs  Lab 12/28/20 1151  INR 1.2   Cardiac Enzymes: Recent Labs  Lab 12/29/20 0533  CKTOTAL 84   BNP (last 3 results) No results for input(s): PROBNP in the last 8760 hours. HbA1C: No results for input(s): HGBA1C in the last 72 hours. CBG: Recent Labs  Lab 12/29/20 0023 12/29/20 0345 12/29/20 0818 12/29/20 0847 12/29/20 1020  GLUCAP 113* 91 49* 67* 115*   Lipid Profile: No results for input(s): CHOL, HDL, LDLCALC, TRIG, CHOLHDL, LDLDIRECT in the last 72 hours. Thyroid Function Tests: No results for input(s): TSH, T4TOTAL, FREET4, T3FREE, THYROIDAB in the last 72 hours. Anemia Panel: No results for input(s): VITAMINB12, FOLATE, FERRITIN, TIBC, IRON, RETICCTPCT in the last 72 hours. Urine analysis:    Component Value Date/Time   COLORURINE Yellow 07/29/2013 0755   APPEARANCEUR Hazy 07/29/2013 0755   LABSPEC 1.013 07/29/2013 0755   PHURINE 5.0 07/29/2013 0755   GLUCOSEU 50 mg/dL 07/29/2013 0755   HGBUR 1+ 07/29/2013 0755   BILIRUBINUR Negative 07/29/2013 0755   KETONESUR Negative 07/29/2013 0755   PROTEINUR >=500 07/29/2013 0755   NITRITE Negative 07/29/2013 0755   LEUKOCYTESUR Negative 07/29/2013 0755    Sepsis Labs: @LABRCNTIP (procalcitonin:4,lacticidven:4)  ) Recent Results (from the past 240 hour(s))  Resp Panel by RT-PCR (Flu A&B, Covid) Nasopharyngeal Swab     Status: None   Collection Time: 12/28/20  2:40 PM   Specimen: Nasopharyngeal Swab; Nasopharyngeal(NP) swabs in vial transport medium  Result Value Ref Range Status   SARS Coronavirus 2 by RT PCR NEGATIVE NEGATIVE Final    Comment: (NOTE) SARS-CoV-2 target nucleic acids are NOT DETECTED.  The SARS-CoV-2 RNA is generally detectable in upper respiratory specimens during the acute phase of infection. The lowest concentration of SARS-CoV-2 viral copies this assay can detect is 138 copies/mL. A negative result does not preclude SARS-Cov-2 infection and should not be used as the sole basis for treatment or other patient management decisions. A negative result may occur with  improper specimen collection/handling, submission of specimen other than nasopharyngeal swab, presence of viral mutation(s) within the areas targeted by this assay, and inadequate number of viral copies(<138 copies/mL). A  negative result must be combined with clinical observations, patient history, and epidemiological information. The expected result is Negative.  Fact Sheet for Patients:  EntrepreneurPulse.com.au  Fact Sheet for Healthcare Providers:  IncredibleEmployment.be  This test is no t yet approved or cleared by the [place] FDA and  has been authorized for detection and/or diagnosis of SARS-CoV-2 by FDA under an Emergency Use Authorization (EUA). This EUA will remain  in effect (meaning this test can be used) for the duration of the COVID-19 declaration under Section 564(b)(1) of the Act, 21 U.S.C.section 360bbb-3(b)(1), unless the authorization is terminated  or revoked sooner.       Influenza A by PCR NEGATIVE NEGATIVE Final   Influenza B by PCR NEGATIVE NEGATIVE Final    Comment: (NOTE) The  Xpert Xpress SARS-CoV-2/FLU/RSV plus assay is intended as an aid in the diagnosis of influenza from Nasopharyngeal swab specimens and should not be used as a sole basis for treatment. Nasal washings and aspirates are unacceptable for Xpert Xpress SARS-CoV-2/FLU/RSV testing.  Fact Sheet for Patients: EntrepreneurPulse.com.au  Fact Sheet for Healthcare Providers: IncredibleEmployment.be  This test is not yet approved or cleared by the [place] FDA and has been authorized for detection and/or diagnosis of SARS-CoV-2 by FDA under an Emergency Use Authorization (EUA). This EUA will remain in effect (meaning this test can be used) for the duration of the COVID-19 declaration under Section 564(b)(1) of the Act, 21 U.S.C. section 360bbb-3(b)(1), unless the authorization is terminated or revoked.  Performed at [facility], [address] [place], [place] [address]       Studies: CT HEAD WO CONTRAST  Result Date: 12/28/2020 CLINICAL DATA:  Aphasia and gait imbalance beginning Thursday EXAM: CT HEAD WITHOUT CONTRAST TECHNIQUE: Contiguous axial images were obtained from the base of the skull through the vertex without intravenous contrast. COMPARISON:  03/20/2020 FINDINGS: Brain: No evidence of acute infarction, hemorrhage, hydrocephalus, extra-axial collection or mass lesion/mass effect. Periventricular white matter hypodensity is a nonspecific finding, but most commonly relates to chronic ischemic small vessel disease. 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 Vascular: No hyperdense vessel or unexpected calcification. Skull: Normal. Negative for fracture or focal lesion. Sinuses/Orbits: No acute finding. Other: None. IMPRESSION: No acute intracranial abnormality Electronically Signed   By: Miachel Roux M.D.   On: 12/28/2020 12:49   MR BRAIN WO CONTRAST  Result Date: 12/28/2020 CLINICAL DATA:  Aphasia and gait disturbance beginning  yesterday. EXAM: MRI HEAD WITHOUT CONTRAST TECHNIQUE: Multiplanar, multiecho pulse sequences of the brain and surrounding structures were obtained without intravenous contrast. COMPARISON:  Head CT earlier same day FINDINGS: Brain: Diffusion imaging does not show any acute or subacute infarction. No focal abnormality affects the brainstem or cerebellum. Cerebral hemispheres show mild age related volume loss. Old small vessel infarctions of the thalami, basal ganglia and hemispheric white matter. No sign of cortical or large vessel territory infarction. No mass lesion, hemorrhage, hydrocephalus or extra-axial collection. Vascular: Major vessels at the base of the brain show flow. Skull and upper cervical spine: Negative Sinuses/Orbits: Clear/normal Other: None IMPRESSION: No acute or subacute finding. Mild age related volume loss. Chronic small-vessel ischemic changes affecting the brain as outlined above. Electronically Signed   By: Nelson Chimes M.D.   On: 12/28/2020 19:53   US RENAL  Result Date: 12/28/2020 CLINICAL DATA:  Acute kidney injury EXAM: RENAL / URINARY TRACT ULTRASOUND COMPLETE COMPARISON:  Renal ultrasound February 09, 2008. FINDINGS: Right Kidney: Renal measurements: 11.7 x 4.9 x 4.8  cm = volume: 133.7 mL. Echogenicity is increased. Renal cortical thickness is normal. No mass, perinephric fluid, or hydronephrosis visualized. No sonographically demonstrable calculus or ureterectasis. Left Kidney: Renal measurements: 11.7 x 5.5 x 5.0 cm = volume: 168.3 mL. Echogenicity is increased. Renal cortical thickness is normal. No perinephric fluid or hydronephrosis visualized. There is a cyst in the upper pole left kidney measuring 1.2 x 1.3 x 1.5 cm. No sonographically demonstrable calculus or ureterectasis. Bladder: Appears normal for degree of bladder distention. Other: Gallbladder wall is thickened with pericholecystic fluid. IMPRESSION: 1. Apparent thickening of the gallbladder wall with pericholecystic  fluid. No gallstones appreciable. Concern for acalculus cholecystitis. 2. Increased renal echogenicity, a finding that may be seen with medical renal disease. No obstructing focus in either kidney. Thickness normal bilaterally. 3.  Cyst upper pole left kidney measuring 1.2 x 1.3 x 1.2 cm. Electronically Signed   By: Lowella Grip III M.D.   On: 12/28/2020 16:41    Scheduled Meds: . allopurinol  100 mg Oral Daily  . ferrous sulfate  325 mg Oral Q breakfast   And  . vitamin C  250 mg Oral Q breakfast  . aspirin EC  81 mg Oral Daily  . atorvastatin  40 mg Oral QHS  . calcium-vitamin D  1 tablet Oral Q breakfast  . clopidogrel  75 mg Oral Daily  . heparin  5,000 Units Subcutaneous Q8H  . metoprolol succinate  50 mg Oral BID  . timolol  1 drop Left Eye Daily    Continuous Infusions: . sodium chloride    .  sodium bicarbonate (isotonic) infusion in sterile water 100 mL/hr at 12/29/20 1020     LOS: 1 day     Cristal Deer, MD [facility]  To reach me or the doctor on call, go to: [email or web address] Password [email or web address]  12/29/2020, 10:31 AM

## 2020-12-30 LAB — CBC
HCT: 25.5 % — ABNORMAL LOW (ref 39.0–52.0)
Hemoglobin: 9.1 g/dL — ABNORMAL LOW (ref 13.0–17.0)
MCH: 28.6 pg (ref 26.0–34.0)
MCHC: 35.7 g/dL (ref 30.0–36.0)
MCV: 80.2 fL (ref 80.0–100.0)
Platelets: 189 10*3/uL (ref 150–400)
RBC: 3.18 MIL/uL — ABNORMAL LOW (ref 4.22–5.81)
RDW: 14.1 % (ref 11.5–15.5)
WBC: 13.6 10*3/uL — ABNORMAL HIGH (ref 4.0–10.5)
nRBC: 0 % (ref 0.0–0.2)

## 2020-12-30 LAB — URINALYSIS, ROUTINE W REFLEX MICROSCOPIC
Bilirubin Urine: NEGATIVE
Glucose, UA: 50 mg/dL — AB
Ketones, ur: NEGATIVE mg/dL
Leukocytes,Ua: NEGATIVE
Nitrite: NEGATIVE
Protein, ur: 30 mg/dL — AB
Specific Gravity, Urine: 1.006 (ref 1.005–1.030)
pH: 5 (ref 5.0–8.0)

## 2020-12-30 LAB — BASIC METABOLIC PANEL
Anion gap: 14 (ref 5–15)
BUN: 81 mg/dL — ABNORMAL HIGH (ref 8–23)
CO2: 28 mmol/L (ref 22–32)
Calcium: 8.3 mg/dL — ABNORMAL LOW (ref 8.9–10.3)
Chloride: 92 mmol/L — ABNORMAL LOW (ref 98–111)
Creatinine, Ser: 6.27 mg/dL — ABNORMAL HIGH (ref 0.61–1.24)
GFR, Estimated: 9 mL/min — ABNORMAL LOW (ref >60)
Glucose, Bld: 145 mg/dL — ABNORMAL HIGH (ref 70–99)
Potassium: 3.4 mmol/L — ABNORMAL LOW (ref 3.5–5.1)
Sodium: 134 mmol/L — ABNORMAL LOW (ref 135–145)

## 2020-12-30 LAB — GLUCOSE, CAPILLARY
Glucose-Capillary: 84 mg/dL (ref 70–99)
Glucose-Capillary: 139 mg/dL — ABNORMAL HIGH (ref 70–99)
Glucose-Capillary: 168 mg/dL — ABNORMAL HIGH (ref 70–99)
Glucose-Capillary: 191 mg/dL — ABNORMAL HIGH (ref 70–99)
Glucose-Capillary: 209 mg/dL — ABNORMAL HIGH (ref 70–99)

## 2020-12-30 NOTE — Progress Notes (Signed)
PROGRESS NOTE  Nathaniel Nguyen PPJ:[id number] DOB: 12/13/1945 DOA: 12/28/2020 PCP: Tracie Harrier, MD  HPI/Recap of past 24 hours: Patient seen and examined at bedside his daughter Levada Dy was at bedside.  He denies any new complain.  Update 09/01/2020: Patient seen and examined at bedside her other daughter Pamala Hurry is at bedside.  Patient denies any complaints overnight  Assessment/Plan: Principal Problem:   AKI (acute kidney injury) ([place]) Active Problems:   CKD stage 3 due to type 2 diabetes mellitus ([place])   Type 2 diabetes mellitus with hypoglycemia without coma ([place])   Coronary artery disease   Hypertension   Transaminitis   1.  Acute kidney injury on chronic kidney disease stage IV.  This is due to prerenal azotemia secondary to poor appetite and diarrhea.  His current GFR is 22.  Nephrology was consulted they advised to avoid IV contrast exposure they do not think he needs dialysis at this time advised to continue IV hydration and to monitor renal function..  Obtain renal ultrasound.  Continue IV hydration with bicarbonate  2.  Type 2 diabetes mellitus.  Contributing to chronic kidney disease.  He was actually well controlled and he was hypoglycemic on arrival in the ED with blood sugar of 46.  This is secondary to his poor p.o. intake  3.  Hypertension well-controlled.  Continue home remedy of bladder pain and metoprolol..  4.  Coronary artery disease: Stable.  Continue aspirin and atorvastatin  Code Status: Full  Severity of Illness: The appropriate patient status for this patient is INPATIENT. Inpatient status is judged to be reasonable and necessary in order to provide the required intensity of service to ensure the patient's safety. The patient's presenting symptoms, physical exam findings, and initial radiographic and laboratory data in the context of their chronic comorbidities is felt to place them at high risk for further clinical deterioration. Furthermore, it is not  anticipated that the patient will be medically stable for discharge from the hospital within 2 midnights of admission. The following factors support the patient status of inpatient.   " The patient's presenting symptoms include acute kidney injury and fever. " The worrisome physical exam findings include  " The initial radiographic and laboratory data are worrisome because of fever " The chronic co-morbidities include diabetes and hypertension   * I certify that at the point of admission it is my clinical judgment that the patient will require inpatient hospital care spanning beyond 2 midnights from the point of admission due to high intensity of service, high risk for further deterioration and high frequency of surveillance required.*    Family Communication: Daughter at bedside [place]: Home when stable   Consultants:  Nephrology  Procedures:  None  Antimicrobials:  None  DVT prophylaxis: Heparin   Objective: Vitals:   12/30/20 0725 12/30/20 1241 12/30/20 1648 12/30/20 1700  BP: 124/77 (!) 143/68 (!) 142/68   Pulse: 80 74 79   Resp: 17 18 17 20   Temp: 98.8 F (37.1 C) 98 F (36.7 C) 98.2 F (36.8 C)   TempSrc:      SpO2: 97% 99% 98%   Weight:      Height:        Intake/Output Summary (Last 24 hours) at 12/30/2020 1848 Last data filed at 12/30/2020 1244 Gross per 24 hour  Intake 240 ml  Output 700 ml  Net -460 ml   Filed Weights   12/28/20 1143  Weight: 77.1 kg   Body mass index is  25.85 kg/m.  Exam:  . General: 75 y.o. year-old male well developed well nourished in no acute distress.  Alert and oriented x3. . Cardiovascular: Regular rate and rhythm with no rubs or gallops.  No thyromegaly or JVD noted.   Marland Kitchen Respiratory: Clear to auscultation with no wheezes or rales. Good inspiratory effort. . Abdomen: Soft nontender nondistended with normal bowel sounds x4 quadrants. . Musculoskeletal: No lower extremity edema. 2/4 pulses in all 4  extremities. . Skin: No ulcerative lesions noted or rashes, . Psychiatry: Mood is appropriate for condition and setting    Data Reviewed: CBC: Recent Labs  Lab 12/28/20 1151 12/29/20 0533 12/30/20 0845  WBC 19.7* 14.8* 13.6*  NEUTROABS 16.6*  --   --   HGB 9.2* 8.6* 9.1*  HCT 26.8* 24.2* 25.5*  MCV 83.5 79.9* 80.2  PLT 192 168 086   Basic Metabolic Panel: Recent Labs  Lab 12/28/20 1151 12/29/20 0533 12/30/20 0845  NA 135 134* 134*  K 3.6 3.0* 3.4*  CL 102 101 92*  CO2 19* 23 28  GLUCOSE 46* 72 145*  BUN 79* 83* 81*  CREATININE 6.16* 6.43* 6.27*  CALCIUM 8.7* 8.2* 8.3*   GFR: Estimated Creatinine Clearance: 9.7 mL/min (A) (by C-G formula based on SCr of 6.27 mg/dL (H)). Liver Function Tests: Recent Labs  Lab 12/28/20 1151  AST 84*  ALT 56*  ALKPHOS 83  BILITOT 1.0  PROT 6.8  ALBUMIN 3.4*   No results for input(s): LIPASE, AMYLASE in the last 168 hours. No results for input(s): AMMONIA in the last 168 hours. Coagulation Profile: Recent Labs  Lab 12/28/20 1151  INR 1.2   Cardiac Enzymes: Recent Labs  Lab 12/29/20 0533  CKTOTAL 84   BNP (last 3 results) No results for input(s): PROBNP in the last 8760 hours. HbA1C: No results for input(s): HGBA1C in the last 72 hours. CBG: Recent Labs  Lab 12/29/20 2320 12/30/20 0344 12/30/20 0725 12/30/20 1240 12/30/20 1647  GLUCAP 128* 84 139* 168* 191*   Lipid Profile: No results for input(s): CHOL, HDL, LDLCALC, TRIG, CHOLHDL, LDLDIRECT in the last 72 hours. Thyroid Function Tests: No results for input(s): TSH, T4TOTAL, FREET4, T3FREE, THYROIDAB in the last 72 hours. Anemia Panel: No results for input(s): VITAMINB12, FOLATE, FERRITIN, TIBC, IRON, RETICCTPCT in the last 72 hours. Urine analysis:    Component Value Date/Time   COLORURINE YELLOW (A) 12/29/2020 1535   APPEARANCEUR HAZY (A) 12/29/2020 1535   APPEARANCEUR Hazy 07/29/2013 0755   LABSPEC 1.006 12/29/2020 1535   LABSPEC 1.013 07/29/2013  0755   PHURINE 5.0 12/29/2020 1535   GLUCOSEU 50 (A) 12/29/2020 1535   GLUCOSEU 50 mg/dL 07/29/2013 0755   HGBUR MODERATE (A) 12/29/2020 1535   BILIRUBINUR NEGATIVE 12/29/2020 1535   BILIRUBINUR Negative 07/29/2013 [place] 12/29/2020 1535   PROTEINUR 30 (A) 12/29/2020 1535   NITRITE NEGATIVE 12/29/2020 1535   LEUKOCYTESUR NEGATIVE 12/29/2020 1535   LEUKOCYTESUR Negative 07/29/2013 0755   Sepsis Labs: @LABRCNTIP (procalcitonin:4,lacticidven:4)  ) Recent Results (from the past 240 hour(s))  Resp Panel by RT-PCR (Flu A&B, Covid) Nasopharyngeal Swab     Status: None   Collection Time: 12/28/20  2:40 PM   Specimen: Nasopharyngeal Swab; Nasopharyngeal(NP) swabs in vial transport medium  Result Value Ref Range Status   SARS Coronavirus 2 by RT PCR NEGATIVE NEGATIVE Final    Comment: (NOTE) SARS-CoV-2 target nucleic acids are NOT DETECTED.  The SARS-CoV-2 RNA is generally detectable in upper respiratory specimens during the acute  phase of infection. The lowest concentration of SARS-CoV-2 viral copies this assay can detect is 138 copies/mL. A negative result does not preclude SARS-Cov-2 infection and should not be used as the sole basis for treatment or other patient management decisions. A negative result may occur with  improper specimen collection/handling, submission of specimen other than nasopharyngeal swab, presence of viral mutation(s) within the areas targeted by this assay, and inadequate number of viral copies(<138 copies/mL). A negative result must be combined with clinical observations, patient history, and epidemiological information. The expected result is Negative.  Fact Sheet for Patients:  EntrepreneurPulse.com.au  Fact Sheet for Healthcare Providers:  IncredibleEmployment.be  This test is no t yet approved or cleared by the [place] FDA and  has been authorized for detection and/or diagnosis of SARS-CoV-2  by FDA under an Emergency Use Authorization (EUA). This EUA will remain  in effect (meaning this test can be used) for the duration of the COVID-19 declaration under Section 564(b)(1) of the Act, 21 U.S.C.section 360bbb-3(b)(1), unless the authorization is terminated  or revoked sooner.       Influenza A by PCR NEGATIVE NEGATIVE Final   Influenza B by PCR NEGATIVE NEGATIVE Final    Comment: (NOTE) The Xpert Xpress SARS-CoV-2/FLU/RSV plus assay is intended as an aid in the diagnosis of influenza from Nasopharyngeal swab specimens and should not be used as a sole basis for treatment. Nasal washings and aspirates are unacceptable for Xpert Xpress SARS-CoV-2/FLU/RSV testing.  Fact Sheet for Patients: EntrepreneurPulse.com.au  Fact Sheet for Healthcare Providers: IncredibleEmployment.be  This test is not yet approved or cleared by the [place] FDA and has been authorized for detection and/or diagnosis of SARS-CoV-2 by FDA under an Emergency Use Authorization (EUA). This EUA will remain in effect (meaning this test can be used) for the duration of the COVID-19 declaration under Section 564(b)(1) of the Act, 21 U.S.C. section 360bbb-3(b)(1), unless the authorization is terminated or revoked.  Performed at [facility], [address], [place], [place] [address]       Studies: No results found.  Scheduled Meds: . allopurinol  100 mg Oral Daily  . ferrous sulfate  325 mg Oral Q breakfast   And  . vitamin C  250 mg Oral Q breakfast  . aspirin EC  81 mg Oral Daily  . atorvastatin  40 mg Oral QHS  . calcium-vitamin D  1 tablet Oral Q breakfast  . clopidogrel  75 mg Oral Daily  . heparin  5,000 Units Subcutaneous Q8H  . metoprolol succinate  50 mg Oral BID  . timolol  1 drop Left Eye Daily    Continuous Infusions: .  sodium bicarbonate (isotonic) infusion in sterile water 100 mL/hr at 12/30/20 1328     LOS: 2 days      Cristal Deer, MD [facility]  To reach me or the doctor on call, go to: [email or web address] Password [facility]  12/30/2020, 6:48 PM

## 2020-12-30 NOTE — Progress Notes (Signed)
[facility]  ROUNDING NOTE   Subjective:  [name] is a 75 y.o.  male with  Past medical history of hypertension, MI, CKD, CAD, and diabetes. He presents to the hospital with weakness and slurred speech. He states his daughter noticed this recommended he come get check out.   Patient seen resting in bed Alert and oriented Tolerating meals, poor appetite Denies nausea and vomiting Denies shortness of breath   Objective:  Vital signs in last 24 hours:  Temp:  [98.4 F (36.9 C)-98.8 F (37.1 C)] 98.8 F (37.1 C) (06/05 0725) Pulse Rate:  [71-85] 80 (06/05 0725) Resp:  [16-18] 17 (06/05 0725) BP: (116-134)/(57-77) 124/77 (06/05 0725) SpO2:  [96 %-99 %] 97 % (06/05 0725)  Weight change:  Filed Weights   12/28/20 1143  Weight: 77.1 kg    Intake/Output: I/O last 3 completed shifts: In: 1642.4 [P.O.:240; I.V.:1402.4] Out: 400 [Urine:400]   Intake/Output this shift:  No intake/output data recorded.  Physical Exam: General: NAD, resting in bed  Head: Normocephalic, atraumatic. Moist oral mucosal membranes  Eyes: Anicteric  Lungs:  Clear to auscultation, normal effort  Heart: Regular rate and rhythm  Abdomen:  Soft, nontender  Extremities:  no peripheral edema.  Neurologic: Alert, moving all four extremities  Skin: No lesions    Basic Metabolic Panel: Recent Labs  Lab 12/28/20 1151 12/29/20 0533 12/30/20 0845  NA 135 134* 134*  K 3.6 3.0* 3.4*  CL 102 101 92*  CO2 19* 23 28  GLUCOSE 46* 72 145*  BUN 79* 83* 81*  CREATININE 6.16* 6.43* 6.27*  CALCIUM 8.7* 8.2* 8.3*    Liver Function Tests: Recent Labs  Lab 12/28/20 1151  AST 84*  ALT 56*  ALKPHOS 83  BILITOT 1.0  PROT 6.8  ALBUMIN 3.4*   No results for input(s): LIPASE, AMYLASE in the last 168 hours. No results for input(s): AMMONIA in the last 168 hours.  CBC: Recent Labs  Lab 12/28/20 1151 12/29/20 0533 12/30/20 0845  WBC 19.7* 14.8* 13.6*  NEUTROABS 16.6*  --   --    HGB 9.2* 8.6* 9.1*  HCT 26.8* 24.2* 25.5*  MCV 83.5 79.9* 80.2  PLT 192 168 189    Cardiac Enzymes: Recent Labs  Lab 12/29/20 0533  CKTOTAL 84    BNP: Invalid input(s): POCBNP  CBG: Recent Labs  Lab 12/29/20 2059 12/29/20 2112 12/29/20 2320 12/30/20 0344 12/30/20 0725  GLUCAP 159* 156* 128* 84 139*    Microbiology: Results for orders placed or performed during the hospital encounter of 12/28/20  Resp Panel by RT-PCR (Flu A&B, Covid) Nasopharyngeal Swab     Status: None   Collection Time: 12/28/20  2:40 PM   Specimen: Nasopharyngeal Swab; Nasopharyngeal(NP) swabs in vial transport medium  Result Value Ref Range Status   SARS Coronavirus 2 by RT PCR NEGATIVE NEGATIVE Final    Comment: (NOTE) SARS-CoV-2 target nucleic acids are NOT DETECTED.  The SARS-CoV-2 RNA is generally detectable in upper respiratory specimens during the acute phase of infection. The lowest concentration of SARS-CoV-2 viral copies this assay can detect is 138 copies/mL. A negative result does not preclude SARS-Cov-2 infection and should not be used as the sole basis for treatment or other patient management decisions. A negative result may occur with  improper specimen collection/handling, submission of specimen other than nasopharyngeal swab, presence of viral mutation(s) within the areas targeted by this assay, and inadequate number of viral copies(<138 copies/mL). A negative result must be combined with  clinical observations, patient history, and epidemiological information. The expected result is Negative.  Fact Sheet for Patients:  EntrepreneurPulse.com.au  Fact Sheet for Healthcare Providers:  IncredibleEmployment.be  This test is no t yet approved or cleared by the [place] FDA and  has been authorized for detection and/or diagnosis of SARS-CoV-2 by FDA under an Emergency Use Authorization (EUA). This EUA will remain  in effect (meaning this  test can be used) for the duration of the COVID-19 declaration under Section 564(b)(1) of the Act, 21 U.S.C.section 360bbb-3(b)(1), unless the authorization is terminated  or revoked sooner.       Influenza A by PCR NEGATIVE NEGATIVE Final   Influenza B by PCR NEGATIVE NEGATIVE Final    Comment: (NOTE) The Xpert Xpress SARS-CoV-2/FLU/RSV plus assay is intended as an aid in the diagnosis of influenza from Nasopharyngeal swab specimens and should not be used as a sole basis for treatment. Nasal washings and aspirates are unacceptable for Xpert Xpress SARS-CoV-2/FLU/RSV testing.  Fact Sheet for Patients: EntrepreneurPulse.com.au  Fact Sheet for Healthcare Providers: IncredibleEmployment.be  This test is not yet approved or cleared by the [place] FDA and has been authorized for detection and/or diagnosis of SARS-CoV-2 by FDA under an Emergency Use Authorization (EUA). This EUA will remain in effect (meaning this test can be used) for the duration of the COVID-19 declaration under Section 564(b)(1) of the Act, 21 U.S.C. section 360bbb-3(b)(1), unless the authorization is terminated or revoked.  Performed at [facility], [address] [place], [place] [address]     Coagulation Studies: Recent Labs    12/28/20 1151  LABPROT 14.9  INR 1.2    Urinalysis: No results for input(s): COLORURINE, LABSPEC, PHURINE, GLUCOSEU, HGBUR, BILIRUBINUR, KETONESUR, PROTEINUR, UROBILINOGEN, NITRITE, LEUKOCYTESUR in the last 72 hours.  Invalid input(s): APPERANCEUR    Imaging: DG Chest 2 View  Result Date: 12/29/2020 CLINICAL DATA:  Cough.  History of cardiomyopathy EXAM: CHEST - 2 VIEW COMPARISON:  March 01, 2013 FINDINGS: There is slight scarring in the right base. No edema or airspace opacity. Heart is borderline prominent with pulmonary vascularity normal. No adenopathy. No bone lesions. IMPRESSION: Slight scarring right base. No edema  or airspace opacity. Borderline cardiac prominence. Electronically Signed   By: Lowella Grip III M.D.   On: 12/29/2020 10:38   CT HEAD WO CONTRAST  Result Date: 12/28/2020 CLINICAL DATA:  Aphasia and gait imbalance beginning Thursday EXAM: CT HEAD WITHOUT CONTRAST TECHNIQUE: Contiguous axial images were obtained from the base of the skull through the vertex without intravenous contrast. COMPARISON:  03/20/2020 FINDINGS: Brain: No evidence of acute infarction, hemorrhage, hydrocephalus, extra-axial collection or mass lesion/mass effect. Periventricular white matter hypodensity is a nonspecific finding, but most commonly relates to chronic ischemic small vessel disease. 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 Vascular: No hyperdense vessel or unexpected calcification. Skull: Normal. Negative for fracture or focal lesion. Sinuses/Orbits: No acute finding. Other: None. IMPRESSION: No acute intracranial abnormality Electronically Signed   By: Miachel Roux M.D.   On: 12/28/2020 12:49   MR BRAIN WO CONTRAST  Result Date: 12/28/2020 CLINICAL DATA:  Aphasia and gait disturbance beginning yesterday. EXAM: MRI HEAD WITHOUT CONTRAST TECHNIQUE: Multiplanar, multiecho pulse sequences of the brain and surrounding structures were obtained without intravenous contrast. COMPARISON:  Head CT earlier same day FINDINGS: Brain: Diffusion imaging does not show any acute or subacute infarction. No focal abnormality affects the brainstem or cerebellum. Cerebral hemispheres show mild age related volume loss. Old small vessel  infarctions of the thalami, basal ganglia and hemispheric white matter. No sign of cortical or large vessel territory infarction. No mass lesion, hemorrhage, hydrocephalus or extra-axial collection. Vascular: Major vessels at the base of the brain show flow. Skull and upper cervical spine: Negative Sinuses/Orbits: Clear/normal Other: None IMPRESSION: No acute or subacute finding. Mild age related volume  loss. Chronic small-vessel ischemic changes affecting the brain as outlined above. Electronically Signed   By: Nelson Chimes M.D.   On: 12/28/2020 19:53   US RENAL  Result Date: 12/28/2020 CLINICAL DATA:  Acute kidney injury EXAM: RENAL / URINARY TRACT ULTRASOUND COMPLETE COMPARISON:  Renal ultrasound February 09, 2008. FINDINGS: Right Kidney: Renal measurements: 11.7 x 4.9 x 4.8 cm = volume: 133.7 mL. Echogenicity is increased. Renal cortical thickness is normal. No mass, perinephric fluid, or hydronephrosis visualized. No sonographically demonstrable calculus or ureterectasis. Left Kidney: Renal measurements: 11.7 x 5.5 x 5.0 cm = volume: 168.3 mL. Echogenicity is increased. Renal cortical thickness is normal. No perinephric fluid or hydronephrosis visualized. There is a cyst in the upper pole left kidney measuring 1.2 x 1.3 x 1.5 cm. No sonographically demonstrable calculus or ureterectasis. Bladder: Appears normal for degree of bladder distention. Other: Gallbladder wall is thickened with pericholecystic fluid. IMPRESSION: 1. Apparent thickening of the gallbladder wall with pericholecystic fluid. No gallstones appreciable. Concern for acalculus cholecystitis. 2. Increased renal echogenicity, a finding that may be seen with medical renal disease. No obstructing focus in either kidney. Thickness normal bilaterally. 3.  Cyst upper pole left kidney measuring 1.2 x 1.3 x 1.2 cm. Electronically Signed   By: Lowella Grip III M.D.   On: 12/28/2020 16:41     Medications:   . sodium chloride    .  sodium bicarbonate (isotonic) infusion in sterile water 100 mL/hr at 12/30/20 0226   . allopurinol  100 mg Oral Daily  . ferrous sulfate  325 mg Oral Q breakfast   And  . vitamin C  250 mg Oral Q breakfast  . aspirin EC  81 mg Oral Daily  . atorvastatin  40 mg Oral QHS  . calcium-vitamin D  1 tablet Oral Q breakfast  . clopidogrel  75 mg Oral Daily  . heparin  5,000 Units Subcutaneous Q8H  . metoprolol  succinate  50 mg Oral BID  . timolol  1 drop Left Eye Daily   acetaminophen **OR** acetaminophen, ondansetron **OR** ondansetron (ZOFRAN) IV  Assessment/ Plan:  [name] is a 75 y.o.  male with past medical history of hypertension, MI, CKD, CAD, and diabetes. He presents to the hospital with weakness and slurred speech. He states his daughter noticed this recommended he come get check out.   1. 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  Acute kidney injury likely secondary to prerenal azotemia Based on recent history of poor appetite and diarrhea vs hypotension No acute need for dialysis at this time Renal function with minimal improvement today IVF-Sodium Bicarb @100ml /hr Urinalysis ordered-spoke with nursing about collection Will monitor renal function.   Lab Results  Component Value Date   CREATININE 6.27 (H) 12/30/2020   CREATININE 6.43 (H) 12/29/2020   CREATININE 6.16 (H) 12/28/2020    Intake/Output Summary (Last 24 hours) at 12/30/2020 0951 Last data filed at 12/29/2020 2000 Gross per 24 hour  Intake 1642.35 ml  Output 200 ml  Net 1442.35 ml   2. Diabetes mellitus type II with chronic kidney  disease noninsulin dependent.   Most recent hemoglobin A1c is 6.4 on 09/07/20.  Hypoglycemic in ED to 49.  Currently stable   3. Hypertension: baseline 140s/60s Home regimen includes Amlodipine and metoprolol. Currently 124/77 Will continue IVF  4. Hypokalemia Currently 3.4 Will monitor and improve with appetite Will supplement if needed   LOS: 2 Kenzie Thoreson 6/5/20229:51 AM

## 2020-12-31 DIAGNOSIS — R531 Weakness: Secondary | ICD-10-CM

## 2020-12-31 DIAGNOSIS — E11649 Type 2 diabetes mellitus with hypoglycemia without coma: Secondary | ICD-10-CM

## 2020-12-31 DIAGNOSIS — I10 Essential (primary) hypertension: Secondary | ICD-10-CM

## 2020-12-31 DIAGNOSIS — R7401 Elevation of levels of liver transaminase levels: Secondary | ICD-10-CM

## 2020-12-31 LAB — GLUCOSE, CAPILLARY
Glucose-Capillary: 150 mg/dL — ABNORMAL HIGH (ref 70–99)
Glucose-Capillary: 129 mg/dL — ABNORMAL HIGH (ref 70–99)
Glucose-Capillary: 208 mg/dL — ABNORMAL HIGH (ref 70–99)
Glucose-Capillary: 241 mg/dL — ABNORMAL HIGH (ref 70–99)
Glucose-Capillary: 217 mg/dL — ABNORMAL HIGH (ref 70–99)
Glucose-Capillary: 175 mg/dL — ABNORMAL HIGH (ref 70–99)
Glucose-Capillary: 145 mg/dL — ABNORMAL HIGH (ref 70–99)

## 2020-12-31 LAB — KAPPA/LAMBDA LIGHT CHAINS
Kappa free light chain: 91.4 mg/L — ABNORMAL HIGH (ref 3.3–19.4)
Kappa, lambda light chain ratio: 1.85 — ABNORMAL HIGH (ref 0.26–1.65)
Lambda free light chains: 49.4 mg/L — ABNORMAL HIGH (ref 5.7–26.3)

## 2020-12-31 LAB — BASIC METABOLIC PANEL
Anion gap: 12 (ref 5–15)
BUN: 78 mg/dL — ABNORMAL HIGH (ref 8–23)
CO2: 34 mmol/L — ABNORMAL HIGH (ref 22–32)
Calcium: 8.2 mg/dL — ABNORMAL LOW (ref 8.9–10.3)
Chloride: 90 mmol/L — ABNORMAL LOW (ref 98–111)
Creatinine, Ser: 5.97 mg/dL — ABNORMAL HIGH (ref 0.61–1.24)
GFR, Estimated: 9 mL/min — ABNORMAL LOW (ref >60)
Glucose, Bld: 198 mg/dL — ABNORMAL HIGH (ref 70–99)
Potassium: 3.3 mmol/L — ABNORMAL LOW (ref 3.5–5.1)
Sodium: 136 mmol/L (ref 135–145)

## 2020-12-31 MED ORDER — INSULIN ASPART 100 UNIT/ML IJ SOLN
0.0000 [IU] | Freq: Three times a day (TID) | INTRAMUSCULAR | Status: DC
Start: 2020-12-31 — End: 2021-01-05
  Administered 2020-12-31: 16:00:00 3 [IU] via SUBCUTANEOUS
  Administered 2021-01-01: 5 [IU] via SUBCUTANEOUS
  Administered 2021-01-01 – 2021-01-02 (×2): 1 [IU] via SUBCUTANEOUS
  Administered 2021-01-02 – 2021-01-03 (×3): 3 [IU] via SUBCUTANEOUS
  Administered 2021-01-03: 1 [IU] via SUBCUTANEOUS
  Administered 2021-01-03: 3 [IU] via SUBCUTANEOUS
  Filled 2020-12-31 (×9): qty 1

## 2020-12-31 NOTE — Progress Notes (Signed)
[facility]  ROUNDING NOTE   Subjective:  Mr. Parth Mccormac is a 75 y.o.  male with  Past medical history of hypertension, MI, CKD, CAD, and diabetes. He presents to the hospital with weakness and slurred speech. He states his daughter noticed this recommended he come get check out.   Patient seen resting in bed Alert and oriented States his appetite is still poor, but he tries to eat meals    Objective:  Vital signs in last 24 hours:  Temp:  [97.6 F (36.4 C)-99 F (37.2 C)] 97.6 F (36.4 C) (06/06 1204) Pulse Rate:  [73-80] 77 (06/06 1204) Resp:  [16-20] 18 (06/06 1204) BP: (125-143)/(59-74) 127/74 (06/06 1204) SpO2:  [93 %-99 %] 96 % (06/06 1204)  Weight change:  Filed Weights   12/28/20 1143  Weight: 77.1 kg    Intake/Output: I/O last 3 completed shifts: In: 240 [P.O.:240] Out: 700 [Urine:700]   Intake/Output this shift:  Total I/O In: -  Out: 500 [Urine:500]  Physical Exam: General: NAD, resting in bed  Head: Normocephalic, atraumatic. Moist oral mucosal membranes  Eyes: Anicteric  Lungs:  Clear to auscultation, normal effort  Heart: Regular rate and rhythm  Abdomen:  Soft, nontender  Extremities:  no peripheral edema.  Neurologic: Alert, moving all four extremities  Skin: No lesions    Basic Metabolic Panel: Recent Labs  Lab 12/28/20 1151 12/29/20 0533 12/30/20 0845 12/31/20 1031  NA 135 134* 134* 136  K 3.6 3.0* 3.4* 3.3*  CL 102 101 92* 90*  CO2 19* 23 28 34*  GLUCOSE 46* 72 145* 198*  BUN 79* 83* 81* 78*  CREATININE 6.16* 6.43* 6.27* 5.97*  CALCIUM 8.7* 8.2* 8.3* 8.2*    Liver Function Tests: Recent Labs  Lab 12/28/20 1151  AST 84*  ALT 56*  ALKPHOS 83  BILITOT 1.0  PROT 6.8  ALBUMIN 3.4*   No results for input(s): LIPASE, AMYLASE in the last 168 hours. No results for input(s): AMMONIA in the last 168 hours.  CBC: Recent Labs  Lab 12/28/20 1151 12/29/20 0533 12/30/20 0845  WBC 19.7* 14.8* 13.6*  NEUTROABS  16.6*  --   --   HGB 9.2* 8.6* 9.1*  HCT 26.8* 24.2* 25.5*  MCV 83.5 79.9* 80.2  PLT 192 168 189    Cardiac Enzymes: Recent Labs  Lab 12/29/20 0533  CKTOTAL 84    BNP: Invalid input(s): POCBNP  CBG: Recent Labs  Lab 12/30/20 1647 12/30/20 2010 12/31/20 0430 12/31/20 0753 12/31/20 1206  GLUCAP 191* 209* 150* 129* 208*    Microbiology: Results for orders placed or performed during the hospital encounter of 12/28/20  Resp Panel by RT-PCR (Flu A&B, Covid) Nasopharyngeal Swab     Status: None   Collection Time: 12/28/20  2:40 PM   Specimen: Nasopharyngeal Swab; Nasopharyngeal(NP) swabs in vial transport medium  Result Value Ref Range Status   SARS Coronavirus 2 by RT PCR NEGATIVE NEGATIVE Final    Comment: (NOTE) SARS-CoV-2 target nucleic acids are NOT DETECTED.  The SARS-CoV-2 RNA is generally detectable in upper respiratory specimens during the acute phase of infection. The lowest concentration of SARS-CoV-2 viral copies this assay can detect is 138 copies/mL. A negative result does not preclude SARS-Cov-2 infection and should not be used as the sole basis for treatment or other patient management decisions. A negative result may occur with  improper specimen collection/handling, submission of specimen other than nasopharyngeal swab, presence of viral mutation(s) within the areas targeted by this assay,  and inadequate number of viral copies(<138 copies/mL). A negative result must be combined with clinical observations, patient history, and epidemiological information. The expected result is Negative.  Fact Sheet for Patients:  EntrepreneurPulse.com.au  Fact Sheet for Healthcare Providers:  IncredibleEmployment.be  This test is no t yet approved or cleared by the [place] FDA and  has been authorized for detection and/or diagnosis of SARS-CoV-2 by FDA under an Emergency Use Authorization (EUA). This EUA will remain  in  effect (meaning this test can be used) for the duration of the COVID-19 declaration under Section 564(b)(1) of the Act, 21 U.S.C.section 360bbb-3(b)(1), unless the authorization is terminated  or revoked sooner.       Influenza A by PCR NEGATIVE NEGATIVE Final   Influenza B by PCR NEGATIVE NEGATIVE Final    Comment: (NOTE) The Xpert Xpress SARS-CoV-2/FLU/RSV plus assay is intended as an aid in the diagnosis of influenza from Nasopharyngeal swab specimens and should not be used as a sole basis for treatment. Nasal washings and aspirates are unacceptable for Xpert Xpress SARS-CoV-2/FLU/RSV testing.  Fact Sheet for Patients: EntrepreneurPulse.com.au  Fact Sheet for Healthcare Providers: IncredibleEmployment.be  This test is not yet approved or cleared by the [place] FDA and has been authorized for detection and/or diagnosis of SARS-CoV-2 by FDA under an Emergency Use Authorization (EUA). This EUA will remain in effect (meaning this test can be used) for the duration of the COVID-19 declaration under Section 564(b)(1) of the Act, 21 U.S.C. section 360bbb-3(b)(1), unless the authorization is terminated or revoked.  Performed at [facility], [address] [place], [place] [address]     Coagulation Studies: No results for input(s): LABPROT, INR in the last 72 hours.  Urinalysis: Recent Labs    12/29/20 1535  COLORURINE YELLOW*  LABSPEC 1.006  PHURINE 5.0  GLUCOSEU 50*  HGBUR MODERATE*  BILIRUBINUR NEGATIVE  KETONESUR NEGATIVE  PROTEINUR 30*  NITRITE NEGATIVE  LEUKOCYTESUR NEGATIVE      Imaging: No results found.   Medications:   .  sodium bicarbonate (isotonic) infusion in sterile water 100 mL/hr at 12/31/20 0248   . allopurinol  100 mg Oral Daily  . ferrous sulfate  325 mg Oral Q breakfast   And  . vitamin C  250 mg Oral Q breakfast  . aspirin EC  81 mg Oral Daily  . atorvastatin  40 mg Oral QHS  .  calcium-vitamin D  1 tablet Oral Q breakfast  . clopidogrel  75 mg Oral Daily  . heparin  5,000 Units Subcutaneous Q8H  . metoprolol succinate  50 mg Oral BID  . timolol  1 drop Left Eye Daily   acetaminophen **OR** acetaminophen, ondansetron **OR** ondansetron (ZOFRAN) IV  Assessment/ Plan:  Mr. Ruslan Mccabe is a 75 y.o.  male with past medical history of hypertension, MI, CKD, CAD, and diabetes. He presents to the hospital with weakness and slurred speech. He states his daughter noticed this recommended he come get check out.   1. 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  Acute kidney injury likely secondary to prerenal azotemia Based on recent history of poor appetite and diarrhea vs hypotension No acute need for dialysis at this time UOP 700 ml Renal function continues to improve slightly  IVF-Sodium Bicarb @100ml /hr Will monitor renal function.   Lab Results  Component Value Date   CREATININE 5.97 (H) 12/31/2020   CREATININE 6.27 (H) 12/30/2020  CREATININE 6.43 (H) 12/29/2020    Intake/Output Summary (Last 24 hours) at 12/31/2020 1212 Last data filed at 12/31/2020 0854 Gross per 24 hour  Intake --  Output 1200 ml  Net -1200 ml   2. 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   Most recent hemoglobin A1c is 6.4 on 09/07/20.  Hypoglycemic in ED to 49.  Stable at this time  3. Hypertension: baseline 140s/60s Home regimen includes Amlodipine and metoprolol. Currently 127/74 Will continue IVF  4. Hypokalemia Currently 3.3 Will monitor and improve with appetite Will supplement if needed   LOS: 3 Fatima Fedie 6/6/202212:12 PM

## 2020-12-31 NOTE — Care Management Important Message (Signed)
Important Message  Patient Details  Name: Nathaniel Nguyen MRN: [id number] Date of Birth: Jan 04, 1946   Medicare Important Message Given:  Yes     [name] 12/31/2020, 11:38 AM

## 2020-12-31 NOTE — Progress Notes (Signed)
Report called to Amandeeb on 1C. Telemetry removed. Pt transported via bed to Room 127. Alert and oriented x 4. Pt placed back on telemetry on 1C. Belongings sent with pt. Daughter at bedside.

## 2020-12-31 NOTE — Progress Notes (Addendum)
[facility]  PROGRESS NOTE  Nathaniel Nguyen YTK:[id number] DOB: October 30, 1945 DOA: 12/28/2020 PCP: Tracie Harrier, MD   Brief HPI:   75 year old male with medical history of diabetes mellitus type 2, stage IV CKD with baseline creatinine 2.74 as of April 2022, hypertension, dyslipidemia, cardiomyopathy came to ED with complaints of unsteady gait, poor oral intake and slurred speech.  In the ED, lab work showed creatinine of 6.16, baseline creatinine 2.74 as of April 2022.  CT head showed no acute abnormality.  Patient admitted with acute kidney injury on CKD stage IV.  Nephrology was consulted.    Subjective   Patient seen and examined, wants to go home.  He denies nausea or vomiting.  No shortness of breath.   Assessment/Plan:     Acute kidney injury on CKD stage IV -Likely prerenal azotemia, from poor p.o. 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  Renal ultrasound shows increased renal echogenicity,  consistent with medical renal disease.Albumin/creatinine ratio 149.4.  Follow serum and urine protein electrophoresis. -He denies using NSAIDs, was recently prescribed prednisone for osteoarthritis -Renal ultrasound did not show obstructive uropathy -Patient started on IV sodium bicarbonate at 100 mL/h -Creatinine has slightly improved at 5.97 today, was 6.27 yesterday -Nephrology following    Diabetes mellitus type 2 -Hemoglobin A1c was 6.4 on 09/07/2020 -CBG fairly well controlled, -Start sensitive sliding scale insulin with NovoLog   History of CAD -Stable -Denies chest pain -Continue aspirin, Plavix, metoprolol, atorvastatin   Hypertension -Blood pressure is well controlled -Continue metoprolol; amlodipine is on hold   Transaminitis -Mild elevation of LFTs; unclear etiology -Alk phos 83 -No abdominal pain, no nausea or vomiting -Likely due to statin use -Abdominal ultrasound shows  apparent thickening of gallbladder wall with pericholecystic fluid; no gallstones appreciable.  Concern for acalculous cholecystitis. -We will follow LFTs in a.m.     Scheduled medications:   . allopurinol  100 mg Oral Daily  . ferrous sulfate  325 mg Oral Q breakfast   And  . vitamin C  250 mg Oral Q breakfast  . aspirin EC  81 mg Oral Daily  . atorvastatin  40 mg Oral QHS  . calcium-vitamin D  1 tablet Oral Q breakfast  . clopidogrel  75 mg Oral Daily  . heparin  5,000 Units Subcutaneous Q8H  . metoprolol succinate  50 mg Oral BID  . timolol  1 drop Left Eye Daily         Data Reviewed:   CBG:  Recent Labs  Lab 12/30/20 1647 12/30/20 2010 12/31/20 0430 12/31/20 0753 12/31/20 1206  GLUCAP 191* 209* 150* 129* 208*    SpO2: 96 %    Vitals:   12/31/20 0528 12/31/20 0751 12/31/20 1203 12/31/20 1204  BP: (!) 126/59 125/64  127/74  Pulse: 73 80 78 77  Resp: '16 18  18  ' Temp: 98.9 F (37.2 C)   97.6 F (36.4 C)  TempSrc:      SpO2: 96% 93% 96% 96%  Weight:      Height:         Intake/Output Summary (Last 24 hours) at 12/31/2020 1405 Last data filed at 12/31/2020 0854 Gross per 24 hour  Intake --  Output 500 ml  Net -500 ml    06/04 1901 - 06/06 0700 In: 240 [P.O.:240] Out: 700 [Urine:700]  Filed Weights   12/28/20 1143  Weight: 77.1 kg    CBC:  Recent Labs  Lab  12/28/20 1151 12/29/20 0533 12/30/20 0845  WBC 19.7* 14.8* 13.6*  HGB 9.2* 8.6* 9.1*  HCT 26.8* 24.2* 25.5*  PLT 192 168 189  MCV 83.5 79.9* 80.2  MCH 28.7 28.4 28.6  MCHC 34.3 35.5 35.7  RDW 14.5 13.9 14.1  LYMPHSABS 0.5*  --   --   MONOABS 2.3*  --   --   EOSABS 0.1  --   --   BASOSABS 0.0  --   --     Complete metabolic panel:  Recent Labs  Lab 12/28/20 1151 12/29/20 0533 12/30/20 0845 12/31/20 1031  NA 135 134* 134* 136  K 3.6 3.0* 3.4* 3.3*  CL 102 101 92* 90*  CO2 19* 23 28 34*  GLUCOSE 46* 72 145* 198*  BUN 79* 83* 81* 78*  CREATININE 6.16* 6.43* 6.27*  5.97*  CALCIUM 8.7* 8.2* 8.3* 8.2*  AST 84*  --   --   --   ALT 56*  --   --   --   ALKPHOS 83  --   --   --   BILITOT 1.0  --   --   --   ALBUMIN 3.4*  --   --   --   INR 1.2  --   --   --     No results for input(s): LIPASE, AMYLASE in the last 168 hours.  Recent Labs  Lab 12/28/20 1440  SARSCOV2NAA NEGATIVE    ------------------------------------------------------------------------------------------------------------------ No results for input(s): CHOL, HDL, LDLCALC, TRIG, CHOLHDL, LDLDIRECT in the last 72 hours.  Lab Results  Component Value Date   HGBA1C 6.4 (H) 01/20/2013   ------------------------------------------------------------------------------------------------------------------ No results for input(s): TSH, T4TOTAL, T3FREE, THYROIDAB in the last 72 hours.  Invalid input(s): FREET3 ------------------------------------------------------------------------------------------------------------------ No results for input(s): VITAMINB12, FOLATE, FERRITIN, TIBC, IRON, RETICCTPCT in the last 72 hours.  Coagulation profile Recent Labs  Lab 12/28/20 1151  INR 1.2   No results for input(s): DDIMER in the last 72 hours.  Cardiac Enzymes Recent Labs  Lab 12/29/20 0533  CKTOTAL 84    ------------------------------------------------------------------------------------------------------------------    Component Value Date/Time   BNP 13,505 (H) 03/01/2013 1610     Antibiotics: Anti-infectives (From admission, onward)   None       Radiology Reports  No results found.    DVT prophylaxis: Heparin  Code Status: Full code  Family Communication: No family at bedside   Consultants:  Nephrology  Procedures:      Objective    Physical Examination:    General: Appears in no acute distress  Cardiovascular: S1-S2, regular, no murmur auscultated  Respiratory: Clear to auscultation bilaterally  Abdomen: Abdomen is soft, nontender, no  organomegaly  Extremities: No edema the lower extremities  Neurologic: Alert, oriented x3, intact insight and judgment   Status is: Inpatient  Dispo: The patient is from: Home              Anticipated d/c is to: Home              Anticipated d/c date is: 01/02/2021              Patient currently not stable for discharge  Barrier to discharge-ongoing evaluation and management of acute kidney injury on CKD stage IV  COVID-19 Labs  No results for input(s): DDIMER, FERRITIN, LDH, CRP in the last 72 hours.  Lab Results  Component Value Date   [place] NEGATIVE 12/28/2020   [place] Not Detected 06/14/2019   [place] Not Detected 05/03/2019  Microbiology  Recent Results (from the past 240 hour(s))  Resp Panel by RT-PCR (Flu A&B, Covid) Nasopharyngeal Swab     Status: None   Collection Time: 12/28/20  2:40 PM   Specimen: Nasopharyngeal Swab; Nasopharyngeal(NP) swabs in vial transport medium  Result Value Ref Range Status   SARS Coronavirus 2 by RT PCR NEGATIVE NEGATIVE Final    Comment: (NOTE) SARS-CoV-2 target nucleic acids are NOT DETECTED.  The SARS-CoV-2 RNA is generally detectable in upper respiratory specimens during the acute phase of infection. The lowest concentration of SARS-CoV-2 viral copies this assay can detect is 138 copies/mL. A negative result does not preclude SARS-Cov-2 infection and should not be used as the sole basis for treatment or other patient management decisions. A negative result may occur with  improper specimen collection/handling, submission of specimen other than nasopharyngeal swab, presence of viral mutation(s) within the areas targeted by this assay, and inadequate number of viral copies(<138 copies/mL). A negative result must be combined with clinical observations, patient history, and epidemiological information. The expected result is Negative.  Fact Sheet for Patients:  EntrepreneurPulse.com.au  Fact  Sheet for Healthcare Providers:  IncredibleEmployment.be  This test is no t yet approved or cleared by the [place] FDA and  has been authorized for detection and/or diagnosis of SARS-CoV-2 by FDA under an Emergency Use Authorization (EUA). This EUA will remain  in effect (meaning this test can be used) for the duration of the COVID-19 declaration under Section 564(b)(1) of the Act, 21 U.S.C.section 360bbb-3(b)(1), unless the authorization is terminated  or revoked sooner.       Influenza A by PCR NEGATIVE NEGATIVE Final   Influenza B by PCR NEGATIVE NEGATIVE Final    Comment: (NOTE) The Xpert Xpress SARS-CoV-2/FLU/RSV plus assay is intended as an aid in the diagnosis of influenza from Nasopharyngeal swab specimens and should not be used as a sole basis for treatment. Nasal washings and aspirates are unacceptable for Xpert Xpress SARS-CoV-2/FLU/RSV testing.  Fact Sheet for Patients: EntrepreneurPulse.com.au  Fact Sheet for Healthcare Providers: IncredibleEmployment.be  This test is not yet approved or cleared by the [place] FDA and has been authorized for detection and/or diagnosis of SARS-CoV-2 by FDA under an Emergency Use Authorization (EUA). This EUA will remain in effect (meaning this test can be used) for the duration of the COVID-19 declaration under Section 564(b)(1) of the Act, 21 U.S.C. section 360bbb-3(b)(1), unless the authorization is terminated or revoked.  Performed at [facility], [address] [place], [place] [address]              [facility] If 7PM-7AM, please contact night-coverage at [email or web address], Office  [phone]   12/31/2020, 2:05 PM  LOS: 3 days

## 2021-01-01 LAB — PROTEIN ELECTROPHORESIS, SERUM
A/G Ratio: 1 (ref 0.7–1.7)
Albumin ELP: 2.8 g/dL — ABNORMAL LOW (ref 2.9–4.4)
Alpha-1-Globulin: 0.3 g/dL (ref 0.0–0.4)
Alpha-2-Globulin: 1 g/dL (ref 0.4–1.0)
Beta Globulin: 0.8 g/dL (ref 0.7–1.3)
Gamma Globulin: 0.6 g/dL (ref 0.4–1.8)
Globulin, Total: 2.7 g/dL (ref 2.2–3.9)
Total Protein ELP: 5.5 g/dL — ABNORMAL LOW (ref 6.0–8.5)

## 2021-01-01 LAB — COMPREHENSIVE METABOLIC PANEL
ALT: 61 U/L — ABNORMAL HIGH (ref 0–44)
AST: 74 U/L — ABNORMAL HIGH (ref 15–41)
Albumin: 2.7 g/dL — ABNORMAL LOW (ref 3.5–5.0)
Alkaline Phosphatase: 90 U/L (ref 38–126)
Anion gap: 16 — ABNORMAL HIGH (ref 5–15)
BUN: 70 mg/dL — ABNORMAL HIGH (ref 8–23)
CO2: 36 mmol/L — ABNORMAL HIGH (ref 22–32)
Calcium: 8.5 mg/dL — ABNORMAL LOW (ref 8.9–10.3)
Chloride: 87 mmol/L — ABNORMAL LOW (ref 98–111)
Creatinine, Ser: 5.81 mg/dL — ABNORMAL HIGH (ref 0.61–1.24)
GFR, Estimated: 9 mL/min — ABNORMAL LOW (ref >60)
Glucose, Bld: 121 mg/dL — ABNORMAL HIGH (ref 70–99)
Potassium: 3.4 mmol/L — ABNORMAL LOW (ref 3.5–5.1)
Sodium: 139 mmol/L (ref 135–145)
Total Bilirubin: 1.2 mg/dL (ref 0.3–1.2)
Total Protein: 5.7 g/dL — ABNORMAL LOW (ref 6.5–8.1)

## 2021-01-01 LAB — PROTEIN ELECTRO, RANDOM URINE
Albumin ELP, Urine: 28.4 %
Alpha-1-Globulin, U: 6.4 %
Alpha-2-Globulin, U: 23.3 %
Beta Globulin, U: 18 %
Gamma Globulin, U: 23.9 %
M Component, Ur: 2.3 % — ABNORMAL HIGH
Total Protein, Urine: 43 mg/dL

## 2021-01-01 LAB — HEMOGLOBIN A1C
Hgb A1c MFr Bld: 7.2 % — ABNORMAL HIGH (ref 4.8–5.6)
Mean Plasma Glucose: 160 mg/dL

## 2021-01-01 LAB — GLUCOSE, CAPILLARY
Glucose-Capillary: 105 mg/dL — ABNORMAL HIGH (ref 70–99)
Glucose-Capillary: 121 mg/dL — ABNORMAL HIGH (ref 70–99)
Glucose-Capillary: 142 mg/dL — ABNORMAL HIGH (ref 70–99)
Glucose-Capillary: 148 mg/dL — ABNORMAL HIGH (ref 70–99)
Glucose-Capillary: 197 mg/dL — ABNORMAL HIGH (ref 70–99)
Glucose-Capillary: 220 mg/dL — ABNORMAL HIGH (ref 70–99)
Glucose-Capillary: 275 mg/dL — ABNORMAL HIGH (ref 70–99)

## 2021-01-01 LAB — CBC
HCT: 26.5 % — ABNORMAL LOW (ref 39.0–52.0)
Hemoglobin: 9.2 g/dL — ABNORMAL LOW (ref 13.0–17.0)
MCH: 28.7 pg (ref 26.0–34.0)
MCHC: 34.7 g/dL (ref 30.0–36.0)
MCV: 82.6 fL (ref 80.0–100.0)
Platelets: 250 10*3/uL (ref 150–400)
RBC: 3.21 MIL/uL — ABNORMAL LOW (ref 4.22–5.81)
RDW: 13.9 % (ref 11.5–15.5)
WBC: 12.1 10*3/uL — ABNORMAL HIGH (ref 4.0–10.5)
nRBC: 0 % (ref 0.0–0.2)

## 2021-01-01 MED ORDER — BOOST / RESOURCE BREEZE PO LIQD CUSTOM
1.0000 | Freq: Two times a day (BID) | ORAL | Status: DC
  Administered 2021-01-01 – 2021-01-03 (×5): 1 via ORAL

## 2021-01-01 MED ORDER — POTASSIUM CHLORIDE CRYS ER 20 MEQ PO TBCR
20.0000 meq | EXTENDED_RELEASE_TABLET | Freq: Once | ORAL | Status: AC
  Administered 2021-01-01: 14:00:00 20 meq via ORAL
  Filled 2021-01-01: qty 1

## 2021-01-01 MED ORDER — SODIUM CHLORIDE 0.9 % IV SOLN: INTRAVENOUS | Status: DC

## 2021-01-01 NOTE — TOC Progression Note (Signed)
Transition of Care [facility]) - Progression Note    Patient Details  Name: Ching Rabideau MRN: [id number] Date of Birth: 11/28/1945  [facility] CM/SW [place], RN Phone Number: 01/01/2021, 10:58 AM  Clinical Narrative:   TOC in to see patient and daughter.  Patient lives with his youngest daughter, has no concerns about returning home.  Patient has no concerns about getting to appointments, obtaining medication or taking it as directed.  Patient currently has no home health services or DME.  TOC contact information given, Patient states he has no needs from [facility] at this time.    Expected Discharge Plan: Home/Self Care Barriers to Discharge: Continued Medical Work up  Expected Discharge Plan and Services Expected Discharge Plan: Home/Self Care   Discharge Planning Services: CM Consult                                           Social Determinants of Health (SDOH) Interventions    Readmission Risk Interventions No flowsheet data found.

## 2021-01-01 NOTE — Progress Notes (Signed)
[facility]  ROUNDING NOTE   Subjective:  Mr. Loukas Antonson is a 75 y.o.  male with  Past medical history of hypertension, MI, CKD, CAD, and diabetes. He presents to the hospital with weakness and slurred speech. He states his daughter noticed this recommended he come get check out.   Patient seen resting in bed Alert and oriented States family brings food when they visit Says he feels better today than any other day Denies nausea   Objective:  Vital signs in last 24 hours:  Temp:  [97.6 F (36.4 C)-99 F (37.2 C)] 98 F (36.7 C) (06/07 1152) Pulse Rate:  [71-84] 72 (06/07 1152) Resp:  [16-19] 16 (06/07 1152) BP: (126-152)/(64-81) 134/70 (06/07 1152) SpO2:  [91 %-96 %] 95 % (06/07 1152)  Weight change:  Filed Weights   12/28/20 1143  Weight: 77.1 kg    Intake/Output: I/O last 3 completed shifts: In: -  Out: 1300 [Urine:1300]   Intake/Output this shift:  Total I/O In: 240 [P.O.:240] Out: -   Physical Exam: General: NAD, resting in bed  Head: Normocephalic, atraumatic. Moist oral mucosal membranes  Eyes: Anicteric  Lungs:  Clear to auscultation, normal effort  Heart: Regular rate and rhythm  Abdomen:  Soft, nontender  Extremities:  no peripheral edema.  Neurologic: Alert, moving all four extremities  Skin: No lesions    Basic Metabolic Panel: Recent Labs  Lab 12/28/20 1151 12/29/20 0533 12/30/20 0845 12/31/20 1031 01/01/21 0447  NA 135 134* 134* 136 139  K 3.6 3.0* 3.4* 3.3* 3.4*  CL 102 101 92* 90* 87*  CO2 19* 23 28 34* 36*  GLUCOSE 46* 72 145* 198* 121*  BUN 79* 83* 81* 78* 70*  CREATININE 6.16* 6.43* 6.27* 5.97* 5.81*  CALCIUM 8.7* 8.2* 8.3* 8.2* 8.5*    Liver Function Tests: Recent Labs  Lab 12/28/20 1151 01/01/21 0447  AST 84* 74*  ALT 56* 61*  ALKPHOS 83 90  BILITOT 1.0 1.2  PROT 6.8 5.7*  ALBUMIN 3.4* 2.7*   No results for input(s): LIPASE, AMYLASE in the last 168 hours. No results for input(s): AMMONIA in the  last 168 hours.  CBC: Recent Labs  Lab 12/28/20 1151 12/29/20 0533 12/30/20 0845 01/01/21 0447  WBC 19.7* 14.8* 13.6* 12.1*  NEUTROABS 16.6*  --   --   --   HGB 9.2* 8.6* 9.1* 9.2*  HCT 26.8* 24.2* 25.5* 26.5*  MCV 83.5 79.9* 80.2 82.6  PLT 192 168 189 250    Cardiac Enzymes: Recent Labs  Lab 12/29/20 0533  CKTOTAL 84    BNP: Invalid input(s): POCBNP  CBG: Recent Labs  Lab 12/31/20 2235 01/01/21 0024 01/01/21 0355 01/01/21 0810 01/01/21 1151  GLUCAP 145* 142* 121* 105* 148*    Microbiology: Results for orders placed or performed during the hospital encounter of 12/28/20  Resp Panel by RT-PCR (Flu A&B, Covid) Nasopharyngeal Swab     Status: None   Collection Time: 12/28/20  2:40 PM   Specimen: Nasopharyngeal Swab; Nasopharyngeal(NP) swabs in vial transport medium  Result Value Ref Range Status   SARS Coronavirus 2 by RT PCR NEGATIVE NEGATIVE Final    Comment: (NOTE) SARS-CoV-2 target nucleic acids are NOT DETECTED.  The SARS-CoV-2 RNA is generally detectable in upper respiratory specimens during the acute phase of infection. The lowest concentration of SARS-CoV-2 viral copies this assay can detect is 138 copies/mL. A negative result does not preclude SARS-Cov-2 infection and should not be used as the sole basis for treatment  or other patient management decisions. A negative result may occur with  improper specimen collection/handling, submission of specimen other than nasopharyngeal swab, presence of viral mutation(s) within the areas targeted by this assay, and inadequate number of viral copies(<138 copies/mL). A negative result must be combined with clinical observations, patient history, and epidemiological information. The expected result is Negative.  Fact Sheet for Patients:  EntrepreneurPulse.com.au  Fact Sheet for Healthcare Providers:  IncredibleEmployment.be  This test is no t yet approved or cleared by the  [place] FDA and  has been authorized for detection and/or diagnosis of SARS-CoV-2 by FDA under an Emergency Use Authorization (EUA). This EUA will remain  in effect (meaning this test can be used) for the duration of the COVID-19 declaration under Section 564(b)(1) of the Act, 21 U.S.C.section 360bbb-3(b)(1), unless the authorization is terminated  or revoked sooner.       Influenza A by PCR NEGATIVE NEGATIVE Final   Influenza B by PCR NEGATIVE NEGATIVE Final    Comment: (NOTE) The Xpert Xpress SARS-CoV-2/FLU/RSV plus assay is intended as an aid in the diagnosis of influenza from Nasopharyngeal swab specimens and should not be used as a sole basis for treatment. Nasal washings and aspirates are unacceptable for Xpert Xpress SARS-CoV-2/FLU/RSV testing.  Fact Sheet for Patients: EntrepreneurPulse.com.au  Fact Sheet for Healthcare Providers: IncredibleEmployment.be  This test is not yet approved or cleared by the [place] FDA and has been authorized for detection and/or diagnosis of SARS-CoV-2 by FDA under an Emergency Use Authorization (EUA). This EUA will remain in effect (meaning this test can be used) for the duration of the COVID-19 declaration under Section 564(b)(1) of the Act, 21 U.S.C. section 360bbb-3(b)(1), unless the authorization is terminated or revoked.  Performed at [facility], [address] [place], [place] [address]     Coagulation Studies: No results for input(s): LABPROT, INR in the last 72 hours.  Urinalysis: Recent Labs    12/29/20 1535  COLORURINE YELLOW*  LABSPEC 1.006  PHURINE 5.0  GLUCOSEU 50*  HGBUR MODERATE*  BILIRUBINUR NEGATIVE  KETONESUR NEGATIVE  PROTEINUR 30*  NITRITE NEGATIVE  LEUKOCYTESUR NEGATIVE      Imaging: No results found.   Medications:   . sodium chloride 100 mL/hr at 01/01/21 0942   . allopurinol  100 mg Oral Daily  . ferrous sulfate  325 mg Oral Q  breakfast   And  . vitamin C  250 mg Oral Q breakfast  . aspirin EC  81 mg Oral Daily  . atorvastatin  40 mg Oral QHS  . calcium-vitamin D  1 tablet Oral Q breakfast  . clopidogrel  75 mg Oral Daily  . feeding supplement  1 Container Oral BID BM  . heparin  5,000 Units Subcutaneous Q8H  . insulin aspart  0-9 Units Subcutaneous TID WC  . metoprolol succinate  50 mg Oral BID  . timolol  1 drop Left Eye Daily   acetaminophen **OR** acetaminophen, ondansetron **OR** ondansetron (ZOFRAN) IV  Assessment/ Plan:  Mr. Tosh Glaze is a 75 y.o.  male with past medical history of hypertension, MI, CKD, CAD, and diabetes. He presents to the hospital with weakness and slurred speech. He states his daughter noticed this recommended he come get check out.   1. 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  Acute kidney injury likely secondary to prerenal azotemia Based on recent history of poor appetite and diarrhea vs hypotension  No acute need for dialysis at this time UOP 1.3L Renal function improves slowly IVF-NS @100ml /hr Will monitor renal function.   Lab Results  Component Value Date   CREATININE 5.81 (H) 01/01/2021   CREATININE 5.97 (H) 12/31/2020   CREATININE 6.27 (H) 12/30/2020    Intake/Output Summary (Last 24 hours) at 01/01/2021 1202 Last data filed at 01/01/2021 1000 Gross per 24 hour  Intake 240 ml  Output 800 ml  Net -560 ml   2. 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   Most recent hemoglobin A1c is 6.4 on 09/07/20.  Hypoglycemic in ED to 49.  Stable  3. Hypertension: baseline 140s/60s Home regimen includes Amlodipine and metoprolol. Currently 134/70 Will continue IVF  4. Hypokalemia Currently 3.4 Will monitor and improve with appetite Will supplement if needed   LOS: 4 Rayquan Amrhein 6/7/202212:02 PM

## 2021-01-01 NOTE — Progress Notes (Signed)
Mobility Specialist - Progress Note   01/01/21 1500  Mobility  Activity Ambulated in hall  Level of Assistance Standby assist, set-up cues, supervision of patient - no hands on  Assistive Device None  Distance Ambulated (ft) 320 ft  Mobility Ambulated with assistance in hallway  Mobility Response Tolerated well  Mobility performed by Mobility specialist  $Mobility charge 1 Mobility    Pre-mobility: 75 HR, 100% SpO2 Post-mobility: 93 HR, 97% SpO2   Pt ambulated in hallway pushing IV pole. No LOB. Utilizing RA. Denied SOB. Did trial a lap without AD assist, very mildly unsteady but still no LOB. Pt reports he feels he is close to his baseline.   Kathee Delton Mobility Specialist 01/01/21, 4:05 PM

## 2021-01-01 NOTE — Progress Notes (Signed)
[facility]  PROGRESS NOTE  Tykwon Fera GEZ:[id number] DOB: 1945/08/26 DOA: 12/28/2020 PCP: Tracie Harrier, MD   Brief HPI:   75 year old male with medical history of diabetes mellitus type 2, stage IV CKD with baseline creatinine 2.74 as of April 2022, hypertension, dyslipidemia, cardiomyopathy came to ED with complaints of unsteady gait, poor oral intake and slurred speech.  In the ED, lab work showed creatinine of 6.16, baseline creatinine 2.74 as of April 2022.  CT head showed no acute abnormality.  Patient admitted with acute kidney injury on CKD stage IV.  Nephrology was consulted.    Subjective   Patient seen and examined, denies nausea vomiting.  Denies shortness of breath.   Assessment/Plan:     Acute kidney injury on CKD stage IV -Likely prerenal azotemia, from poor p.o. 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  Renal ultrasound shows increased renal echogenicity,  consistent with medical renal disease.Albumin/creatinine ratio 149.4.  Follow serum and urine protein electrophoresis. -He denies using NSAIDs, was recently prescribed prednisone for osteoarthritis -Renal ultrasound did not show obstructive uropathy -Patient started on IV sodium bicarbonate at 100 mL/h, bicarb is 36 today, chloride 87.  We will switch to IV normal saline at 100 mL/h -Creatinine has slightly improved today to 5.81, was 5.97 yesterday.  -Nephrology following; no indication for dialysis    Diabetes mellitus type 2 -Hemoglobin A1c was 6.4 on 09/07/2020 -CBG fairly well controlled, -Continue sensitive sliding scale insulin with NovoLog   History of CAD -Stable -Denies chest pain -Continue aspirin, Plavix, metoprolol, atorvastatin   Hypertension -Blood pressure is well controlled -Continue metoprolol; amlodipine is on hold   Transaminitis -Mild elevation of LFTs; unclear etiology -AST 74, ALT  61 -Alk phos 90 -No abdominal pain, no nausea or vomiting -Likely due to statin use -Abdominal ultrasound shows apparent thickening of gallbladder wall with pericholecystic fluid; no gallstones appreciable.  Concern for acalculous cholecystitis. -Patient is asymptomatic, no concern for cholecystitis at this time.   Hypokalemia -Potassium was 3.4; give 20 mEq KCl x1. -Follow BMP in am      Scheduled medications:   . allopurinol  100 mg Oral Daily  . ferrous sulfate  325 mg Oral Q breakfast   And  . vitamin C  250 mg Oral Q breakfast  . aspirin EC  81 mg Oral Daily  . atorvastatin  40 mg Oral QHS  . calcium-vitamin D  1 tablet Oral Q breakfast  . clopidogrel  75 mg Oral Daily  . feeding supplement  1 Container Oral BID BM  . heparin  5,000 Units Subcutaneous Q8H  . insulin aspart  0-9 Units Subcutaneous TID WC  . metoprolol succinate  50 mg Oral BID  . timolol  1 drop Left Eye Daily         Data Reviewed:   CBG:  Recent Labs  Lab 12/31/20 2235 01/01/21 0024 01/01/21 0355 01/01/21 0810 01/01/21 1151  GLUCAP 145* 142* 121* 105* 148*    SpO2: 95 %    Vitals:   01/01/21 0021 01/01/21 0353 01/01/21 0811 01/01/21 1152  BP: (!) 150/74 140/71 126/64 134/70  Pulse: 84 80 76 72  Resp: $Remo'17 19 17 16  'XRoPU$ Temp: 98.6 F (37 C) 98.3 F (36.8 C) 98.1 F (36.7 C) 98 F (36.7 C)  TempSrc:      SpO2: 94% 91% 95% 95%  Weight:      Height:  Intake/Output Summary (Last 24 hours) at 01/01/2021 1333 Last data filed at 01/01/2021 1000 Gross per 24 hour  Intake 240 ml  Output 800 ml  Net -560 ml    06/05 1901 - 06/07 0700 In: -  Out: 1300 [Urine:1300]  Filed Weights   12/28/20 1143  Weight: 77.1 kg    CBC:  Recent Labs  Lab 12/28/20 1151 12/29/20 0533 12/30/20 0845 01/01/21 0447  WBC 19.7* 14.8* 13.6* 12.1*  HGB 9.2* 8.6* 9.1* 9.2*  HCT 26.8* 24.2* 25.5* 26.5*  PLT 192 168 189 250  MCV 83.5 79.9* 80.2 82.6  MCH 28.7 28.4 28.6 28.7  MCHC 34.3  35.5 35.7 34.7  RDW 14.5 13.9 14.1 13.9  LYMPHSABS 0.5*  --   --   --   MONOABS 2.3*  --   --   --   EOSABS 0.1  --   --   --   BASOSABS 0.0  --   --   --     Complete metabolic panel:  Recent Labs  Lab 12/28/20 1151 12/29/20 0533 12/30/20 0845 12/31/20 1031 12/31/20 1032 01/01/21 0447  NA 135 134* 134* 136  --  139  K 3.6 3.0* 3.4* 3.3*  --  3.4*  CL 102 101 92* 90*  --  87*  CO2 19* 23 28 34*  --  36*  GLUCOSE 46* 72 145* 198*  --  121*  BUN 79* 83* 81* 78*  --  70*  CREATININE 6.16* 6.43* 6.27* 5.97*  --  5.81*  CALCIUM 8.7* 8.2* 8.3* 8.2*  --  8.5*  AST 84*  --   --   --   --  74*  ALT 56*  --   --   --   --  61*  ALKPHOS 83  --   --   --   --  90  BILITOT 1.0  --   --   --   --  1.2  ALBUMIN 3.4*  --   --   --   --  2.7*  INR 1.2  --   --   --   --   --   HGBA1C  --   --   --   --  7.2*  --     No results for input(s): LIPASE, AMYLASE in the last 168 hours.  Recent Labs  Lab 12/28/20 1440  SARSCOV2NAA NEGATIVE    ------------------------------------------------------------------------------------------------------------------ No results for input(s): CHOL, HDL, LDLCALC, TRIG, CHOLHDL, LDLDIRECT in the last 72 hours.  Lab Results  Component Value Date   HGBA1C 7.2 (H) 12/31/2020   ------------------------------------------------------------------------------------------------------------------ No results for input(s): TSH, T4TOTAL, T3FREE, THYROIDAB in the last 72 hours.  Invalid input(s): FREET3 ------------------------------------------------------------------------------------------------------------------ No results for input(s): VITAMINB12, FOLATE, FERRITIN, TIBC, IRON, RETICCTPCT in the last 72 hours.  Coagulation profile Recent Labs  Lab 12/28/20 1151  INR 1.2   No results for input(s): DDIMER in the last 72 hours.  Cardiac Enzymes Recent Labs  Lab 12/29/20 0533  CKTOTAL 84     ------------------------------------------------------------------------------------------------------------------    Component Value Date/Time   BNP 13,505 (H) 03/01/2013 7124     Antibiotics: Anti-infectives (From admission, onward)   None       Radiology Reports  No results found.    DVT prophylaxis: Heparin  Code Status: Full code  Family Communication: No family at bedside   Consultants:  Nephrology  Procedures:      Objective    Physical Examination:    General-appears in no  acute distress  Heart-S1-S2, regular, no murmur auscultated  Lungs-clear to auscultation bilaterally, no wheezing or crackles auscultated  Abdomen-soft, nontender, no organomegaly  Extremities-no edema in the lower extremities  Neuro-alert, oriented x3, no focal deficit noted   Status is: Inpatient  Dispo: The patient is from: Home              Anticipated d/c is to: Home              Anticipated d/c date is: 01/02/2021              Patient currently not stable for discharge  Barrier to discharge-ongoing evaluation and management of acute kidney injury on CKD stage IV  COVID-19 Labs  No results for input(s): DDIMER, FERRITIN, LDH, CRP in the last 72 hours.  Lab Results  Component Value Date   SARSCOV2NAA NEGATIVE 12/28/2020   SARSCOV2NAA Not Detected 06/14/2019   SARSCOV2NAA Not Detected 05/03/2019    Microbiology  Recent Results (from the past 240 hour(s))  Resp Panel by RT-PCR (Flu A&B, Covid) Nasopharyngeal Swab     Status: None   Collection Time: 12/28/20  2:40 PM   Specimen: Nasopharyngeal Swab; Nasopharyngeal(NP) swabs in vial transport medium  Result Value Ref Range Status   SARS Coronavirus 2 by RT PCR NEGATIVE NEGATIVE Final    Comment: (NOTE) SARS-CoV-2 target nucleic acids are NOT DETECTED.  The SARS-CoV-2 RNA is generally detectable in upper respiratory specimens during the acute phase of infection. The lowest concentration of  SARS-CoV-2 viral copies this assay can detect is 138 copies/mL. A negative result does not preclude SARS-Cov-2 infection and should not be used as the sole basis for treatment or other patient management decisions. A negative result may occur with  improper specimen collection/handling, submission of specimen other than nasopharyngeal swab, presence of viral mutation(s) within the areas targeted by this assay, and inadequate number of viral copies(<138 copies/mL). A negative result must be combined with clinical observations, patient history, and epidemiological information. The expected result is Negative.  Fact Sheet for Patients:  BloggerCourse.com  Fact Sheet for Healthcare Providers:  SeriousBroker.it  This test is no t yet approved or cleared by the [place] FDA and  has been authorized for detection and/or diagnosis of SARS-CoV-2 by FDA under an Emergency Use Authorization (EUA). This EUA will remain  in effect (meaning this test can be used) for the duration of the COVID-19 declaration under Section 564(b)(1) of the Act, 21 U.S.C.section 360bbb-3(b)(1), unless the authorization is terminated  or revoked sooner.       Influenza A by PCR NEGATIVE NEGATIVE Final   Influenza B by PCR NEGATIVE NEGATIVE Final    Comment: (NOTE) The Xpert Xpress SARS-CoV-2/FLU/RSV plus assay is intended as an aid in the diagnosis of influenza from Nasopharyngeal swab specimens and should not be used as a sole basis for treatment. Nasal washings and aspirates are unacceptable for Xpert Xpress SARS-CoV-2/FLU/RSV testing.  Fact Sheet for Patients: BloggerCourse.com  Fact Sheet for Healthcare Providers: SeriousBroker.it  This test is not yet approved or cleared by the [place] FDA and has been authorized for detection and/or diagnosis of SARS-CoV-2 by FDA under an Emergency Use  Authorization (EUA). This EUA will remain in effect (meaning this test can be used) for the duration of the COVID-19 declaration under Section 564(b)(1) of the Act, 21 U.S.C. section 360bbb-3(b)(1), unless the authorization is terminated or revoked.  Performed at [facility], [address], [place], [place] [address]  Oswald Hillock   [facility] If 7PM-7AM, please contact night-coverage at [email or web address], Office  [phone]   01/01/2021, 1:33 PM  LOS: 4 days

## 2021-01-02 LAB — GLUCOSE, CAPILLARY
Glucose-Capillary: 143 mg/dL — ABNORMAL HIGH (ref 70–99)
Glucose-Capillary: 238 mg/dL — ABNORMAL HIGH (ref 70–99)
Glucose-Capillary: 228 mg/dL — ABNORMAL HIGH (ref 70–99)
Glucose-Capillary: 175 mg/dL — ABNORMAL HIGH (ref 70–99)

## 2021-01-02 LAB — BASIC METABOLIC PANEL
Anion gap: 9 (ref 5–15)
BUN: 59 mg/dL — ABNORMAL HIGH (ref 8–23)
CO2: 37 mmol/L — ABNORMAL HIGH (ref 22–32)
Calcium: 8.1 mg/dL — ABNORMAL LOW (ref 8.9–10.3)
Chloride: 93 mmol/L — ABNORMAL LOW (ref 98–111)
Creatinine, Ser: 5.18 mg/dL — ABNORMAL HIGH (ref 0.61–1.24)
GFR, Estimated: 11 mL/min — ABNORMAL LOW (ref >60)
Glucose, Bld: 152 mg/dL — ABNORMAL HIGH (ref 70–99)
Potassium: 3.3 mmol/L — ABNORMAL LOW (ref 3.5–5.1)
Sodium: 139 mmol/L (ref 135–145)

## 2021-01-02 NOTE — Progress Notes (Addendum)
PROGRESS NOTE   HPI was taken from Dr. Francine Graven: Nathaniel Nguyen is a 75 y.o. 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 His daughter states that he has been weak for about 4 days and he noted that his gait is unsteady but he has not had any falls.  Yesterday while talking to him on the phone she felt his speech was slurred and she asked one of her sisters to go stay with him.  They also state the patient oral intake has been very poor and that he appears very weak. Patient denies having any falls, and denies feeling dizzy or lightheaded. He denies having any nausea, no vomiting, no abdominal pain, no changes in his bowel habits, no headache, no fever, no chills, no cough, no focal deficits, no blurred vision, no headache. Labs show sodium 135, potassium 3.6, chloride 102, bicarb 19, glucose 46, BUN 79 compared to baseline of 49, creatinine 6.16 compared to baseline of 2.74 from April, 2022, calcium 8.7, alkaline phosphatase 83, albumin 3.4, AST 84, ALT 56, total protein 6.8, white count 19.7, hemoglobin 9.2, hematocrit 26.8, MCV 83.5, RDW 15.5, platelet count 192, PT 14.9, INR 1.2 CT scan of the head without contrast shows no acute intracranial abnormality Twelve-lead EKG reviewed by me shows normal sinus rhythm with a left axis deviation.   ED Course: Patient is a 75 year old male who was brought into the ER by his daughters for evaluation of unsteady gait as well as slurred speech. Labs show worsening of his renal function from baseline, in April 2022, patient had a serum creatinine of 2.74 and a BUN of 46 and on admission today his serum creatinine is 6.16 and BUN is elevated at 79. He also has a leukocytosis of 19K with no evidence of an infectious process at this  time. He will be admitted to the hospital for further evaluation.  As per Dr. Darrick Meigs: 75 year old male with medical history of diabetes mellitus type 2, stage IV CKD with baseline creatinine 2.74 as of April 2022, hypertension, dyslipidemia, cardiomyopathy came to ED with complaints of unsteady gait, poor oral intake and slurred speech.  In the ED, lab work showed creatinine of 6.16, baseline creatinine 2.74 as of April 2022.  CT head showed no acute abnormality.  Patient admitted with acute kidney injury on CKD stage IV.  Nephrology was consulted.  As per Dr. Jimmye Norman 01/02/21: Pt's Cr is trending down from day prior. Pt still on IVFs for AKI on CKDIV as per nephro. Pt's baseline Cr is 2.74 on 11/15/20.   Nathaniel Nguyen  CHE:[id number] DOB: 03-01-1946 DOA: 12/28/2020 PCP: Tracie Harrier, MD    Assessment & Plan:   Principal Problem:   AKI (acute kidney injury) ([place]) Active Problems:   CKD stage 3 due to type 2 diabetes mellitus ([place])   Type 2 diabetes mellitus with hypoglycemia without coma ([place])   Coronary artery disease   Hypertension   Transaminitis   AKI on CKDIV: likely secondary to prerenal azotemia from poor po intake. Cr is trending down from day prior. Hx of DM2, HTN, & likely diabetic nephropathy. Denies NSAIDs use. Continue on IVFs. Nephro is following and recs apprec  DM2: well controlled w/ HbA1c 6.4. Continue on SSI w/ accuchecks  Hx of CAD: stable. Continue on metoprolol, statin, plavix & aspirin  HTN:  continue on home dose of metoprolol. Continue to hold amlodipine  Transaminitis: possibly secondary to statin use. US shows apparent thickening of the gallbladder wall w/ pericholecystic fluid. No gallstones appreciable. Concern for acalculous cholecystitis. Asymptomatic   Hypokalemia: management as per nephro as pt as AKI on CKDIV   DVT prophylaxis: heparin  Code Status: full  Family Communication: discussed pt's care w/ pt's family at bedside and answered their  questions  Disposition Plan: likely d/c back home  Level of care: Med-Surg   Status is: Inpatient  Remains inpatient appropriate because:IV treatments appropriate due to intensity of illness or inability to take PO and Inpatient level of care appropriate due to severity of illness   Dispo: The patient is from: Home              Anticipated d/c is to: Home              Patient currently is not medically stable to d/c.   Difficult to place patient: unclear    Consultants:  nephro    Procedures:    Antimicrobials:    Subjective: Pt c/o malaise   Objective: Vitals:   01/01/21 1657 01/01/21 2202 01/01/21 2340 01/02/21 0400  BP: (!) 141/70 132/69 (!) 151/94 (!) 148/78  Pulse: 75 74 80 79  Resp: 17 17 18 18   Temp: 97.7 F (36.5 C) 98.8 F (37.1 C) 99.2 F (37.3 C) 98.8 F (37.1 C)  TempSrc:  Oral Oral Oral  SpO2: 96% 95% 92% 93%  Weight:      Height:        Intake/Output Summary (Last 24 hours) at 01/02/2021 0721 Last data filed at 01/02/2021 0317 Gross per 24 hour  Intake 2110.17 ml  Output 1100 ml  Net 1010.17 ml   Filed Weights   12/28/20 1143  Weight: 77.1 kg    Examination:  General exam: Appears calm and comfortable  Respiratory system: Clear to auscultation. Respiratory effort normal. Cardiovascular system: S1 & S2 +. No rubs, gallops or clicks.. Gastrointestinal system: Abdomen is nondistended, soft and nontender. Normal bowel sounds heard. Central nervous system: Alert and oriented. Moves all 4 extremities  Psychiatry: Judgement and insight appear normal. Mood & affect appropriate.     Data Reviewed: I have personally reviewed following labs and imaging studies  CBC: Recent Labs  Lab 12/28/20 1151 12/29/20 0533 12/30/20 0845 01/01/21 0447  WBC 19.7* 14.8* 13.6* 12.1*  NEUTROABS 16.6*  --   --   --   HGB 9.2* 8.6* 9.1* 9.2*  HCT 26.8* 24.2* 25.5* 26.5*  MCV 83.5 79.9* 80.2 82.6  PLT 192 168 189 536   Basic Metabolic Panel: Recent  Labs  Lab 12/29/20 0533 12/30/20 0845 12/31/20 1031 01/01/21 0447 01/02/21 0443  NA 134* 134* 136 139 139  K 3.0* 3.4* 3.3* 3.4* 3.3*  CL 101 92* 90* 87* 93*  CO2 23 28 34* 36* 37*  GLUCOSE 72 145* 198* 121* 152*  BUN 83* 81* 78* 70* 59*  CREATININE 6.43* 6.27* 5.97* 5.81* 5.18*  CALCIUM 8.2* 8.3* 8.2* 8.5* 8.1*   GFR: Estimated Creatinine Clearance: 11.7 mL/min (A) (by C-G formula based on SCr of 5.18 mg/dL (H)). Liver Function Tests: Recent Labs  Lab 12/28/20 1151 01/01/21 0447  AST 84* 74*  ALT 56* 61*  ALKPHOS 83 90  BILITOT 1.0 1.2  PROT 6.8 5.7*  ALBUMIN 3.4* 2.7*   No results for input(s): LIPASE, AMYLASE in the last 168 hours. No results for input(s): AMMONIA in  the last 168 hours. Coagulation Profile: Recent Labs  Lab 12/28/20 1151  INR 1.2   Cardiac Enzymes: Recent Labs  Lab 12/29/20 0533  CKTOTAL 84   BNP (last 3 results) No results for input(s): PROBNP in the last 8760 hours. HbA1C: Recent Labs    12/31/20 1032  HGBA1C 7.2*   CBG: Recent Labs  Lab 01/01/21 0810 01/01/21 1151 01/01/21 1656 01/01/21 2009 01/01/21 2122  GLUCAP 105* 148* 275* 220* 197*   Lipid Profile: No results for input(s): CHOL, HDL, LDLCALC, TRIG, CHOLHDL, LDLDIRECT in the last 72 hours. Thyroid Function Tests: No results for input(s): TSH, T4TOTAL, FREET4, T3FREE, THYROIDAB in the last 72 hours. Anemia Panel: No results for input(s): VITAMINB12, FOLATE, FERRITIN, TIBC, IRON, RETICCTPCT in the last 72 hours. Sepsis Labs: No results for input(s): PROCALCITON, LATICACIDVEN in the last 168 hours.  Recent Results (from the past 240 hour(s))  Resp Panel by RT-PCR (Flu A&B, Covid) Nasopharyngeal Swab     Status: None   Collection Time: 12/28/20  2:40 PM   Specimen: Nasopharyngeal Swab; Nasopharyngeal(NP) swabs in vial transport medium  Result Value Ref Range Status   SARS Coronavirus 2 by RT PCR NEGATIVE NEGATIVE Final    Comment: (NOTE) SARS-CoV-2 target nucleic  acids are NOT DETECTED.  The SARS-CoV-2 RNA is generally detectable in upper respiratory specimens during the acute phase of infection. The lowest concentration of SARS-CoV-2 viral copies this assay can detect is 138 copies/mL. A negative result does not preclude SARS-Cov-2 infection and should not be used as the sole basis for treatment or other patient management decisions. A negative result may occur with  improper specimen collection/handling, submission of specimen other than nasopharyngeal swab, presence of viral mutation(s) within the areas targeted by this assay, and inadequate number of viral copies(<138 copies/mL). A negative result must be combined with clinical observations, patient history, and epidemiological information. The expected result is Negative.  Fact Sheet for Patients:  EntrepreneurPulse.com.au  Fact Sheet for Healthcare Providers:  IncredibleEmployment.be  This test is no t yet approved or cleared by the [place] FDA and  has been authorized for detection and/or diagnosis of SARS-CoV-2 by FDA under an Emergency Use Authorization (EUA). This EUA will remain  in effect (meaning this test can be used) for the duration of the COVID-19 declaration under Section 564(b)(1) of the Act, 21 U.S.C.section 360bbb-3(b)(1), unless the authorization is terminated  or revoked sooner.       Influenza A by PCR NEGATIVE NEGATIVE Final   Influenza B by PCR NEGATIVE NEGATIVE Final    Comment: (NOTE) The Xpert Xpress SARS-CoV-2/FLU/RSV plus assay is intended as an aid in the diagnosis of influenza from Nasopharyngeal swab specimens and should not be used as a sole basis for treatment. Nasal washings and aspirates are unacceptable for Xpert Xpress SARS-CoV-2/FLU/RSV testing.  Fact Sheet for Patients: EntrepreneurPulse.com.au  Fact Sheet for Healthcare Providers: IncredibleEmployment.be  This  test is not yet approved or cleared by the [place] FDA and has been authorized for detection and/or diagnosis of SARS-CoV-2 by FDA under an Emergency Use Authorization (EUA). This EUA will remain in effect (meaning this test can be used) for the duration of the COVID-19 declaration under Section 564(b)(1) of the Act, 21 U.S.C. section 360bbb-3(b)(1), unless the authorization is terminated or revoked.  Performed at [facility], [address] [place], [place] [address]          Radiology Studies: No results found.      Scheduled Meds: .  allopurinol  100 mg Oral Daily  . ferrous sulfate  325 mg Oral Q breakfast   And  . vitamin C  250 mg Oral Q breakfast  . aspirin EC  81 mg Oral Daily  . atorvastatin  40 mg Oral QHS  . calcium-vitamin D  1 tablet Oral Q breakfast  . clopidogrel  75 mg Oral Daily  . feeding supplement  1 Container Oral BID BM  . heparin  5,000 Units Subcutaneous Q8H  . insulin aspart  0-9 Units Subcutaneous TID WC  . metoprolol succinate  50 mg Oral BID  . timolol  1 drop Left Eye Daily   Continuous Infusions: . sodium chloride 100 mL/hr at 01/02/21 0404     LOS: 5 days    Time spent: 31 mins     Wyvonnia Dusky, MD [facility] Pager [phone]  If 7PM-7AM, please contact night-coverage 01/02/2021, 7:21 AM

## 2021-01-02 NOTE — Plan of Care (Signed)
  Problem: Nutrition: Goal: Adequate nutrition will be maintained Outcome: Not Progressing  Pt ate oatmeal for breakfast and nothing for lunch nor dinner.  He said it did not taste good.

## 2021-01-02 NOTE — Care Management Important Message (Signed)
Important Message  Patient Details  Name: Nathaniel Nguyen MRN: [id number] Date of Birth: [birth year]   Medicare Important Message Given:  Yes     Juliann Pulse A Solei Wubben 01/02/2021, 11:47 AM

## 2021-01-02 NOTE — Plan of Care (Signed)

## 2021-01-02 NOTE — Progress Notes (Signed)
Inpatient Diabetes Program Recommendations  AACE/ADA: New Consensus Statement on Inpatient Glycemic Control (2015)  Target Ranges:  Prepandial:   less than 140 mg/dL      Peak postprandial:   less than 180 mg/dL (1-2 hours)      Critically ill patients:  140 - 180 mg/dL   Results for CALIL, AMOR (MRN [id number]) as of 01/02/2021 12:14  Ref. Range 01/01/2021 08:10 01/01/2021 11:51 01/01/2021 16:56 01/01/2021 20:09 01/01/2021 21:22  Glucose-Capillary Latest Ref Range: 70 - 99 mg/dL 105 (H) 148 (H)  1 unit NOVOLOG  275 (H)  5 units NOVOLOG  220 (H) 197 (H)   Results for IRBY, FAILS (MRN [id number]) as of 01/02/2021 12:14  Ref. Range 01/02/2021 08:19 01/02/2021 11:47  Glucose-Capillary Latest Ref Range: 70 - 99 mg/dL 143 (H)  1 unit NOVOLOG  238 (H)    Home DM Meds: Amaryl 1 mg Daily  Current Orders: Novolog 0-9 units TID   MD- Please consider adding low dose Novolog Meal Coverage:  Novolog 2 units TID with meals  Hold if pt eats <50% of meal, Hold if pt NPO     --Will follow patient during hospitalization--  Wyn Quaker RN, MSN, CDE Diabetes Coordinator Inpatient Glycemic Control Team Team Pager: [phone] (8a-5p)

## 2021-01-02 NOTE — Progress Notes (Signed)
[facility]  ROUNDING NOTE   Subjective:  [name] is a 75 y.o.  male with  Past medical history of hypertension, MI, CKD, CAD, and diabetes. He presents to the hospital with weakness and slurred speech. He states his daughter noticed this recommended he come get check out.   Patient seen resting in bed Daughter at bedside States he feels really well today Continues to have poor appetite but believed to be due to hospital. Family has brought meals and patient eats well Denies nausea and vomiting Denies shortness of breath   Objective:  Vital signs in last 24 hours:  Temp:  [97.7 F (36.5 C)-99.2 F (37.3 C)] 98.4 F (36.9 C) (06/08 0821) Pulse Rate:  [72-80] 78 (06/08 0821) Resp:  [16-18] 17 (06/08 0821) BP: (132-151)/(69-94) 139/74 (06/08 0821) SpO2:  [92 %-96 %] 96 % (06/08 0821)  Weight change:  Filed Weights   12/28/20 1143  Weight: 77.1 kg    Intake/Output: I/O last 3 completed shifts: In: 2110.2 [P.O.:360; I.V.:1750.2] Out: 1900 [Urine:1900]   Intake/Output this shift:  Total I/O In: 237 [P.O.:237] Out: -   Physical Exam: General: NAD, resting in bed  Head: Normocephalic, atraumatic. Moist oral mucosal membranes  Eyes: Anicteric  Lungs:  Clear to auscultation, normal effort  Heart: Regular rate and rhythm  Abdomen:  Soft, nontender  Extremities:  no peripheral edema.  Neurologic: Alert, moving all four extremities  Skin: No lesions    Basic Metabolic Panel: Recent Labs  Lab 12/29/20 0533 12/30/20 0845 12/31/20 1031 01/01/21 0447 01/02/21 0443  NA 134* 134* 136 139 139  K 3.0* 3.4* 3.3* 3.4* 3.3*  CL 101 92* 90* 87* 93*  CO2 23 28 34* 36* 37*  GLUCOSE 72 145* 198* 121* 152*  BUN 83* 81* 78* 70* 59*  CREATININE 6.43* 6.27* 5.97* 5.81* 5.18*  CALCIUM 8.2* 8.3* 8.2* 8.5* 8.1*    Liver Function Tests: Recent Labs  Lab 12/28/20 1151 01/01/21 0447  AST 84* 74*  ALT 56* 61*  ALKPHOS 83 90  BILITOT 1.0 1.2  PROT 6.8  5.7*  ALBUMIN 3.4* 2.7*   No results for input(s): LIPASE, AMYLASE in the last 168 hours. No results for input(s): AMMONIA in the last 168 hours.  CBC: Recent Labs  Lab 12/28/20 1151 12/29/20 0533 12/30/20 0845 01/01/21 0447  WBC 19.7* 14.8* 13.6* 12.1*  NEUTROABS 16.6*  --   --   --   HGB 9.2* 8.6* 9.1* 9.2*  HCT 26.8* 24.2* 25.5* 26.5*  MCV 83.5 79.9* 80.2 82.6  PLT 192 168 189 250    Cardiac Enzymes: Recent Labs  Lab 12/29/20 0533  CKTOTAL 84    BNP: Invalid input(s): POCBNP  CBG: Recent Labs  Lab 01/01/21 1151 01/01/21 1656 01/01/21 2009 01/01/21 2122 01/02/21 0819  GLUCAP 148* 275* 220* 197* 143*    Microbiology: Results for orders placed or performed during the hospital encounter of 12/28/20  Resp Panel by RT-PCR (Flu A&B, Covid) Nasopharyngeal Swab     Status: None   Collection Time: 12/28/20  2:40 PM   Specimen: Nasopharyngeal Swab; Nasopharyngeal(NP) swabs in vial transport medium  Result Value Ref Range Status   SARS Coronavirus 2 by RT PCR NEGATIVE NEGATIVE Final    Comment: (NOTE) SARS-CoV-2 target nucleic acids are NOT DETECTED.  The SARS-CoV-2 RNA is generally detectable in upper respiratory specimens during the acute phase of infection. The lowest concentration of SARS-CoV-2 viral copies this assay can detect is 138 copies/mL. A negative  result does not preclude SARS-Cov-2 infection and should not be used as the sole basis for treatment or other patient management decisions. A negative result may occur with  improper specimen collection/handling, submission of specimen other than nasopharyngeal swab, presence of viral mutation(s) within the areas targeted by this assay, and inadequate number of viral copies(<138 copies/mL). A negative result must be combined with clinical observations, patient history, and epidemiological information. The expected result is Negative.  Fact Sheet for Patients:   EntrepreneurPulse.com.au  Fact Sheet for Healthcare Providers:  IncredibleEmployment.be  This test is no t yet approved or cleared by the [place] FDA and  has been authorized for detection and/or diagnosis of SARS-CoV-2 by FDA under an Emergency Use Authorization (EUA). This EUA will remain  in effect (meaning this test can be used) for the duration of the COVID-19 declaration under Section 564(b)(1) of the Act, 21 U.S.C.section 360bbb-3(b)(1), unless the authorization is terminated  or revoked sooner.       Influenza A by PCR NEGATIVE NEGATIVE Final   Influenza B by PCR NEGATIVE NEGATIVE Final    Comment: (NOTE) The Xpert Xpress SARS-CoV-2/FLU/RSV plus assay is intended as an aid in the diagnosis of influenza from Nasopharyngeal swab specimens and should not be used as a sole basis for treatment. Nasal washings and aspirates are unacceptable for Xpert Xpress SARS-CoV-2/FLU/RSV testing.  Fact Sheet for Patients: EntrepreneurPulse.com.au  Fact Sheet for Healthcare Providers: IncredibleEmployment.be  This test is not yet approved or cleared by the [place] FDA and has been authorized for detection and/or diagnosis of SARS-CoV-2 by FDA under an Emergency Use Authorization (EUA). This EUA will remain in effect (meaning this test can be used) for the duration of the COVID-19 declaration under Section 564(b)(1) of the Act, 21 U.S.C. section 360bbb-3(b)(1), unless the authorization is terminated or revoked.  Performed at [facility], [address] [place], [place] [address]     Coagulation Studies: No results for input(s): LABPROT, INR in the last 72 hours.  Urinalysis: No results for input(s): COLORURINE, LABSPEC, PHURINE, GLUCOSEU, HGBUR, BILIRUBINUR, KETONESUR, PROTEINUR, UROBILINOGEN, NITRITE, LEUKOCYTESUR in the last 72 hours.  Invalid input(s): APPERANCEUR    Imaging: No  results found.   Medications:   . sodium chloride 100 mL/hr at 01/02/21 0404   . allopurinol  100 mg Oral Daily  . ferrous sulfate  325 mg Oral Q breakfast   And  . vitamin C  250 mg Oral Q breakfast  . aspirin EC  81 mg Oral Daily  . atorvastatin  40 mg Oral QHS  . calcium-vitamin D  1 tablet Oral Q breakfast  . clopidogrel  75 mg Oral Daily  . feeding supplement  1 Container Oral BID BM  . heparin  5,000 Units Subcutaneous Q8H  . insulin aspart  0-9 Units Subcutaneous TID WC  . metoprolol succinate  50 mg Oral BID  . timolol  1 drop Left Eye Daily   acetaminophen **OR** acetaminophen, ondansetron **OR** ondansetron (ZOFRAN) IV  Assessment/ Plan:  [name] is a 75 y.o.  male with past medical history of hypertension, MI, CKD, CAD, and diabetes. He presents to the hospital with weakness and slurred speech. He states his daughter noticed this recommended he come get check out.   1. 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  Acute kidney injury likely secondary to prerenal azotemia Based on recent history of poor appetite and diarrhea  vs hypotension No acute need for dialysis at this time UOP 1.1L Renal function continues to slowly improve IVF-NS @100ml /hr Patient followed by [facility] nephrology, cleared to discharge with follow up in a week with outpatient nephrology  Lab Results  Component Value Date   CREATININE 5.18 (H) 01/02/2021   CREATININE 5.81 (H) 01/01/2021   CREATININE 5.97 (H) 12/31/2020    Intake/Output Summary (Last 24 hours) at 01/02/2021 1023 Last data filed at 01/02/2021 0954 Gross per 24 hour  Intake 2107.17 ml  Output 1100 ml  Net 1007.17 ml   2. 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   Most recent hemoglobin A1c is 6.4 on 09/07/20.  Hypoglycemic in ED to 49.  Elevated but stable  3. Hypertension: baseline 140s/60s Home regimen includes  Amlodipine and metoprolol. BP back in baseline range  4. Hypokalemia Currently 3.3 Will monitor and improve with appetite    LOS: 5 Nathaniel Nguyen 6/8/202210:23 AM

## 2021-01-03 LAB — CBC WITH DIFFERENTIAL/PLATELET
Abs Immature Granulocytes: 0.22 10*3/uL — ABNORMAL HIGH (ref 0.00–0.07)
Basophils Absolute: 0 10*3/uL (ref 0.0–0.1)
Basophils Relative: 0 %
Eosinophils Absolute: 0.1 10*3/uL (ref 0.0–0.5)
Eosinophils Relative: 2 %
HCT: 25.5 % — ABNORMAL LOW (ref 39.0–52.0)
Hemoglobin: 8.5 g/dL — ABNORMAL LOW (ref 13.0–17.0)
Immature Granulocytes: 3 %
Lymphocytes Relative: 11 %
Lymphs Abs: 0.9 10*3/uL (ref 0.7–4.0)
MCH: 28.5 pg (ref 26.0–34.0)
MCHC: 33.3 g/dL (ref 30.0–36.0)
MCV: 85.6 fL (ref 80.0–100.0)
Monocytes Absolute: 1.1 10*3/uL — ABNORMAL HIGH (ref 0.1–1.0)
Monocytes Relative: 13 %
Neutro Abs: 6.1 10*3/uL (ref 1.7–7.7)
Neutrophils Relative %: 71 %
Platelets: 234 10*3/uL (ref 150–400)
RBC: 2.98 MIL/uL — ABNORMAL LOW (ref 4.22–5.81)
RDW: 14.8 % (ref 11.5–15.5)
WBC: 8.4 10*3/uL (ref 4.0–10.5)
nRBC: 0 % (ref 0.0–0.2)

## 2021-01-03 LAB — BASIC METABOLIC PANEL
Anion gap: 10 (ref 5–15)
BUN: 46 mg/dL — ABNORMAL HIGH (ref 8–23)
CO2: 31 mmol/L (ref 22–32)
Calcium: 8.2 mg/dL — ABNORMAL LOW (ref 8.9–10.3)
Chloride: 101 mmol/L (ref 98–111)
Creatinine, Ser: 4.54 mg/dL — ABNORMAL HIGH (ref 0.61–1.24)
GFR, Estimated: 13 mL/min — ABNORMAL LOW (ref >60)
Glucose, Bld: 132 mg/dL — ABNORMAL HIGH (ref 70–99)
Potassium: 3.5 mmol/L (ref 3.5–5.1)
Sodium: 142 mmol/L (ref 135–145)

## 2021-01-03 LAB — COMPREHENSIVE METABOLIC PANEL
ALT: 145 U/L — ABNORMAL HIGH (ref 0–44)
AST: 189 U/L — ABNORMAL HIGH (ref 15–41)
Albumin: 2.6 g/dL — ABNORMAL LOW (ref 3.5–5.0)
Alkaline Phosphatase: 88 U/L (ref 38–126)
Anion gap: 9 (ref 5–15)
BUN: 45 mg/dL — ABNORMAL HIGH (ref 8–23)
CO2: 31 mmol/L (ref 22–32)
Calcium: 7.8 mg/dL — ABNORMAL LOW (ref 8.9–10.3)
Chloride: 98 mmol/L (ref 98–111)
Creatinine, Ser: 4.36 mg/dL — ABNORMAL HIGH (ref 0.61–1.24)
GFR, Estimated: 13 mL/min — ABNORMAL LOW (ref >60)
Glucose, Bld: 351 mg/dL — ABNORMAL HIGH (ref 70–99)
Potassium: 3.7 mmol/L (ref 3.5–5.1)
Sodium: 138 mmol/L (ref 135–145)
Total Bilirubin: 1.1 mg/dL (ref 0.3–1.2)
Total Protein: 5.4 g/dL — ABNORMAL LOW (ref 6.5–8.1)

## 2021-01-03 LAB — GLUCOSE, CAPILLARY
Glucose-Capillary: 144 mg/dL — ABNORMAL HIGH (ref 70–99)
Glucose-Capillary: 243 mg/dL — ABNORMAL HIGH (ref 70–99)
Glucose-Capillary: 337 mg/dL — ABNORMAL HIGH (ref 70–99)
Glucose-Capillary: 223 mg/dL — ABNORMAL HIGH (ref 70–99)

## 2021-01-03 NOTE — Evaluation (Signed)
Occupational Therapy Evaluation Patient Details Name: Nathaniel Nguyen MRN: [id number] DOB: 24-Oct-1945 Today's Date: 01/03/2021    History of Present Illness Pt is a 75 y.o. male presenting to hospital 6/3 with aphasia and gait imbalance that started on Thursday; also c/o weakness x7 days (with occasional diarrhea with poor p.o. intake).  Pt admitted with acute kidney injury and DM.   PMH includes htn, HLD, DM, CKD, CAD, MI, V TACH ablation.   Clinical Impression    Upon entering the room, pt supine in bed and agreeable to OT intervention. Pt reports living at home with his daughter who works night shift. He is a retired Designer, industrial/product and reports independence with self care, IADLs, and drives. Pt reports slurred speech has resolved and he is "almost back to normal". Pt demonstrated bed mobility and LB dressing independently . Pt ambulates to bathroom while pushing IV pole and performs toileting needs without assistance. Pt stands at sink for grooming needs and hand hygiene without LOB before returning to bed.Pt with no skilled OT need at this time. OT to SIGN OFF.  Follow Up Recommendations     No OT follow up        Precautions / Restrictions Precautions Precautions: Fall Restrictions Weight Bearing Restrictions: No      Mobility Bed Mobility Overal bed mobility: Modified Independent             General bed mobility comments: Semi-supine to/from sitting without any noted difficulty    Transfers Overall transfer level: Independent Equipment used: None             General transfer comment: steady safe transfers noted    Balance Overall balance assessment: Needs assistance Sitting-balance support: No upper extremity supported;Feet supported Sitting balance-Leahy Scale: Normal Sitting balance - Comments: steady sitting reaching outside BOS   Standing balance support: No upper extremity supported;During functional activity Standing balance-Leahy Scale:  Good Standing balance comment: no loss of balance with ambulation and head turns R/L/up/down, increasing/decreasing speed, and turning 180 degrees and stopping                           ADL either performed or assessed with clinical judgement   ADL Overall ADL's : At baseline;Modified independent                                             Vision Baseline Vision/History: Wears glasses Wears Glasses: Reading only Patient Visual Report: No change from baseline              Pertinent Vitals/Pain Pain Assessment: No/denies pain     Hand Dominance Right   Extremity/Trunk Assessment Upper Extremity Assessment Upper Extremity Assessment: Generalized weakness   Lower Extremity Assessment Lower Extremity Assessment: Generalized weakness   Cervical / Trunk Assessment Cervical / Trunk Assessment: Normal   Communication Communication Communication: No difficulties   Cognition Arousal/Alertness: Awake/alert Behavior During Therapy: WFL for tasks assessed/performed Overall Cognitive Status: Within Functional Limits for tasks assessed                                                Home Living Family/patient expects to be discharged to:: Private residence Living Arrangements: Children Available Help  at Discharge: Family Type of Home: House Home Access: Level entry     Home Layout: One level     Bathroom Shower/Tub: Teacher, early years/pre: Standard     Home Equipment: Grab bars - tub/shower          Prior Functioning/Environment Level of Independence: Independent        Comments: No h/o recent falls.        OT Problem List:        OT Treatment/Interventions:      OT Goals(Current goals can be found in the care plan section) Acute Rehab OT Goals Patient Stated Goal: to go home OT Goal Formulation: With patient Time For Goal Achievement: 01/03/21 Potential to Achieve Goals: Good   AM-PAC OT  "6 Clicks" Daily Activity     Outcome Measure Help from another person eating meals?: None Help from another person taking care of personal grooming?: None Help from another person toileting, which includes using toliet, bedpan, or urinal?: None Help from another person bathing (including washing, rinsing, drying)?: None Help from another person to put on and taking off regular upper body clothing?: None Help from another person to put on and taking off regular lower body clothing?: None 6 Click Score: 24   End of Session    Activity Tolerance: Patient tolerated treatment well Patient left: in bed;with bed alarm set;with call bell/phone within reach                   Time: 1442-1506 OT Time Calculation (min): 24 min Charges:  OT General Charges $OT Visit: 1 Visit OT Evaluation $OT Eval Low Complexity: 1 Low OT Treatments $Self Care/Home Management : 8-22 mins  Darleen Crocker, MS, OTR/L , CBIS ascom [phone]  01/03/21, 3:58 PM

## 2021-01-03 NOTE — Progress Notes (Signed)
PROGRESS NOTE  Nathaniel Nguyen TDV:[id number] DOB: October 05, 1945 DOA: 12/28/2020 PCP: Tracie Harrier, MD  HPI/Recap of past 24 hours: Nathaniel Nguyen is a 75 y.o. 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  In the ED, lab work showed creatinine of 6.16, baseline creatinine 2.74 as of April 2022.  CT head and MRI brain were nonacute.  Patient admitted with acute kidney injury on CKD stage IV.    01/03/2021: Patient was seen and examined with his daughter at his bedside.  He has no other complaints.  His creatinine continues to downtrend with IV fluid hydration.  We will repeat BMP this afternoon to further assess his renal function for DC planning.  He was seen by PT with no further recommendations.  Assessment/Plan: Principal Problem:   AKI (acute kidney injury) ([place]) Active Problems:   CKD stage 3 due to type 2 diabetes mellitus ([place])   Type 2 diabetes mellitus with hypoglycemia without coma ([place])   Coronary artery disease   Hypertension   Transaminitis  AKI on CKDIV likely prerenal in the setting of dehydration with poor oral intake: Likely secondary to prerenal azotemia from poor po intake.  Creatinine is downtrending, 4.5  Baseline creatinine 2.0  Repeat BMP this afternoon for DC planning.   Continue to avoid nephrotoxic agents, dehydration and hypotension.   Continue to closely monitor urine output.   BMP this p.m. and tomorrow a.m.     DM2: Well controlled w/ HbA1c 6.4. Continue on SSI w/ accuchecks   Hx of CAD: stable.  Prior to admission on metoprolol, statin, plavix & aspirin, continue current management.   HTN: BP is at goal.  Continue on home dose of metoprolol. Continue to hold amlodipine   Transaminitis, unclear  etiology. Repeat CMP this afternoon for DC planning. He is currently on Lipitor 40 mg daily, closely monitor LFTs while on statin. US shows apparent thickening of the gallbladder wall w/ pericholecystic fluid. No gallstones appreciable. Concern for acalculous cholecystitis.  Patient denies any abdominal pain.  No nausea.   Resolved Hypokalemia: Serum potassium 3.5. Judicious with replacement due to advance AKI on CKD 4.     DVT prophylaxis: heparin SQ 3 times daily. Code Status: full  Family Communication: discussed pt's care w/ pt's family at bedside and answered their questions  Disposition Plan: likely d/c back home   Level of care: Med-Surg    Status is: Inpatient   Remains inpatient appropriate because:IV treatments appropriate due to intensity of illness or inability to take PO and Inpatient level of care appropriate due to severity of illness     Dispo: The patient is from: Home              Anticipated d/c is to: Home possibly on 01/04/2021 if renal function continues to improve              Patient currently is not medically stable to d/c.              Difficult to place patient: unclear       Consultants:  nephro    Procedures:    Antimicrobials:    Objective: Vitals:   01/02/21 2326 01/03/21 0403 01/03/21 0756 01/03/21 1124  BP: (!) 156/72 (!) 149/77 (!) 147/74 136/68  Pulse: 83 75  79 76  Resp: 20 18 16 16   Temp: 99.3 F (37.4 C) 98.7 F (37.1 C) 98.6 F (37 C) 98.8 F (37.1 C)  TempSrc: Oral Oral Oral Oral  SpO2: 97% 95% 96% 97%  Weight:      Height:        Intake/Output Summary (Last 24 hours) at 01/03/2021 1532 Last data filed at 01/03/2021 1500 Gross per 24 hour  Intake 3000.79 ml  Output 600 ml  Net 2400.79 ml   Filed Weights   12/28/20 1143  Weight: 77.1 kg    Exam:  General: 75 y.o. year-old male well developed well nourished in no acute distress.  Alert and oriented x3. Cardiovascular: Regular rate and rhythm with no rubs or gallops.   No thyromegaly or JVD noted.   Respiratory: Clear to auscultation with no wheezes or rales. Good inspiratory effort. Abdomen: Soft nontender nondistended with normal bowel sounds x4 quadrants. Musculoskeletal: No lower extremity edema. 2/4 pulses in all 4 extremities. Skin: No ulcerative lesions noted or rashes, Psychiatry: Mood is appropriate for condition and setting   Data Reviewed: CBC: Recent Labs  Lab 12/28/20 1151 12/29/20 0533 12/30/20 0845 01/01/21 0447  WBC 19.7* 14.8* 13.6* 12.1*  NEUTROABS 16.6*  --   --   --   HGB 9.2* 8.6* 9.1* 9.2*  HCT 26.8* 24.2* 25.5* 26.5*  MCV 83.5 79.9* 80.2 82.6  PLT 192 168 189 937   Basic Metabolic Panel: Recent Labs  Lab 12/30/20 0845 12/31/20 1031 01/01/21 0447 01/02/21 0443 01/03/21 0822  NA 134* 136 139 139 142  K 3.4* 3.3* 3.4* 3.3* 3.5  CL 92* 90* 87* 93* 101  CO2 28 34* 36* 37* 31  GLUCOSE 145* 198* 121* 152* 132*  BUN 81* 78* 70* 59* 46*  CREATININE 6.27* 5.97* 5.81* 5.18* 4.54*  CALCIUM 8.3* 8.2* 8.5* 8.1* 8.2*   GFR: Estimated Creatinine Clearance: 13.4 mL/min (A) (by C-G formula based on SCr of 4.54 mg/dL (H)). Liver Function Tests: Recent Labs  Lab 12/28/20 1151 01/01/21 0447  AST 84* 74*  ALT 56* 61*  ALKPHOS 83 90  BILITOT 1.0 1.2  PROT 6.8 5.7*  ALBUMIN 3.4* 2.7*   No results for input(s): LIPASE, AMYLASE in the last 168 hours. No results for input(s): AMMONIA in the last 168 hours. Coagulation Profile: Recent Labs  Lab 12/28/20 1151  INR 1.2   Cardiac Enzymes: Recent Labs  Lab 12/29/20 0533  CKTOTAL 84   BNP (last 3 results) No results for input(s): PROBNP in the last 8760 hours. HbA1C: No results for input(s): HGBA1C in the last 72 hours. CBG: Recent Labs  Lab 01/02/21 1147 01/02/21 1611 01/02/21 2148 01/03/21 0757 01/03/21 1126  GLUCAP 238* 228* 175* 144* 243*   Lipid Profile: No results for input(s): CHOL, HDL, LDLCALC, TRIG, CHOLHDL, LDLDIRECT in the last 72 hours. Thyroid  Function Tests: No results for input(s): TSH, T4TOTAL, FREET4, T3FREE, THYROIDAB in the last 72 hours. Anemia Panel: No results for input(s): VITAMINB12, FOLATE, FERRITIN, TIBC, IRON, RETICCTPCT in the last 72 hours. Urine analysis:    Component Value Date/Time   COLORURINE YELLOW (A) 12/29/2020 1535   APPEARANCEUR HAZY (A) 12/29/2020 1535   APPEARANCEUR Hazy 07/29/2013 0755   LABSPEC 1.006 12/29/2020 1535   LABSPEC 1.013 07/29/2013 0755   PHURINE 5.0 12/29/2020 1535   GLUCOSEU 50 (A) 12/29/2020 1535   GLUCOSEU 50 mg/dL 07/29/2013 0755   HGBUR MODERATE (A) 12/29/2020 1535   BILIRUBINUR NEGATIVE 12/29/2020 1535   BILIRUBINUR Negative  07/29/2013 [place] 12/29/2020 1535   PROTEINUR 30 (A) 12/29/2020 1535   NITRITE NEGATIVE 12/29/2020 1535   LEUKOCYTESUR NEGATIVE 12/29/2020 1535   LEUKOCYTESUR Negative 07/29/2013 0755   Sepsis Labs: @LABRCNTIP (procalcitonin:4,lacticidven:4)  ) Recent Results (from the past 240 hour(s))  Resp Panel by RT-PCR (Flu A&B, Covid) Nasopharyngeal Swab     Status: None   Collection Time: 12/28/20  2:40 PM   Specimen: Nasopharyngeal Swab; Nasopharyngeal(NP) swabs in vial transport medium  Result Value Ref Range Status   SARS Coronavirus 2 by RT PCR NEGATIVE NEGATIVE Final    Comment: (NOTE) SARS-CoV-2 target nucleic acids are NOT DETECTED.  The SARS-CoV-2 RNA is generally detectable in upper respiratory specimens during the acute phase of infection. The lowest concentration of SARS-CoV-2 viral copies this assay can detect is 138 copies/mL. A negative result does not preclude SARS-Cov-2 infection and should not be used as the sole basis for treatment or other patient management decisions. A negative result may occur with  improper specimen collection/handling, submission of specimen other than nasopharyngeal swab, presence of viral mutation(s) within the areas targeted by this assay, and inadequate number of viral copies(<138  copies/mL). A negative result must be combined with clinical observations, patient history, and epidemiological information. The expected result is Negative.  Fact Sheet for Patients:  EntrepreneurPulse.com.au  Fact Sheet for Healthcare Providers:  IncredibleEmployment.be  This test is no t yet approved or cleared by the [place] FDA and  has been authorized for detection and/or diagnosis of SARS-CoV-2 by FDA under an Emergency Use Authorization (EUA). This EUA will remain  in effect (meaning this test can be used) for the duration of the COVID-19 declaration under Section 564(b)(1) of the Act, 21 U.S.C.section 360bbb-3(b)(1), unless the authorization is terminated  or revoked sooner.       Influenza A by PCR NEGATIVE NEGATIVE Final   Influenza B by PCR NEGATIVE NEGATIVE Final    Comment: (NOTE) The Xpert Xpress SARS-CoV-2/FLU/RSV plus assay is intended as an aid in the diagnosis of influenza from Nasopharyngeal swab specimens and should not be used as a sole basis for treatment. Nasal washings and aspirates are unacceptable for Xpert Xpress SARS-CoV-2/FLU/RSV testing.  Fact Sheet for Patients: EntrepreneurPulse.com.au  Fact Sheet for Healthcare Providers: IncredibleEmployment.be  This test is not yet approved or cleared by the [place] FDA and has been authorized for detection and/or diagnosis of SARS-CoV-2 by FDA under an Emergency Use Authorization (EUA). This EUA will remain in effect (meaning this test can be used) for the duration of the COVID-19 declaration under Section 564(b)(1) of the Act, 21 U.S.C. section 360bbb-3(b)(1), unless the authorization is terminated or revoked.  Performed at [facility], [address], [place],  [address]       Studies: No results found.  Scheduled Meds:  allopurinol  100 mg Oral Daily   ferrous sulfate  325 mg Oral Q breakfast    And   vitamin C  250 mg Oral Q breakfast   aspirin EC  81 mg Oral Daily   atorvastatin  40 mg Oral QHS   calcium-vitamin D  1 tablet Oral Q breakfast   clopidogrel  75 mg Oral Daily   feeding supplement  1 Container Oral BID BM   heparin  5,000 Units Subcutaneous Q8H   insulin aspart  0-9 Units Subcutaneous TID WC   metoprolol succinate  50 mg Oral BID   timolol  1 drop Left Eye Daily    Continuous  Infusions:  sodium chloride 100 mL/hr at 01/03/21 0558     LOS: 6 days     Kayleen Memos, MD [facility] Pager [phone]  If 7PM-7AM, please contact night-coverage [email or web address] Password [facility] 01/03/2021, 3:32 PM

## 2021-01-03 NOTE — Evaluation (Signed)
Physical Therapy Evaluation Patient Details Name: Nathaniel Nguyen MRN: [id number] DOB: 1945-10-29 Today's Date: 01/03/2021   History of Present Illness  Pt is a 75 y.o. male presenting to hospital 6/3 with aphasia and gait imbalance that started on Thursday; also c/o weakness x7 days (with occasional diarrhea with poor p.o. intake).  Pt admitted with acute kidney injury and DM.   PMH includes htn, HLD, DM, CKD, CAD, MI, V TACH ablation.  Clinical Impression  Prior to hospital admission, pt was independent with ambulation; lives with his daughter in 1 level home (no steps to enter).  Currently pt is modified independent with bed mobility; independent with transfers; and CGA ambulating 3 laps around nursing station.  No loss of balance noted with ambulation and head turns R/L/up/down, increasing/decreasing speed, and turning 180 degrees and stopping.  Pt demonstrating generalized weakness but overall did fairly well during sessions activities.  Pt would benefit from skilled PT to address noted impairments and functional limitations during hospitalization (see below for any additional details).  Upon hospital discharge, no further PT needs anticipated.    Follow Up Recommendations No PT follow up    Equipment Recommendations  None recommended by PT    Recommendations for Other Services       Precautions / Restrictions Precautions Precautions: Fall Restrictions Weight Bearing Restrictions: No      Mobility  Bed Mobility Overal bed mobility: Modified Independent             General bed mobility comments: Semi-supine to/from sitting without any noted difficulty    Transfers Overall transfer level: Independent Equipment used: None             General transfer comment: steady safe transfers noted  Ambulation/Gait Ambulation/Gait assistance: Min guard Gait Distance (Feet): 520 Feet Assistive device: None Gait Pattern/deviations: Step-through pattern Gait velocity: mildly  decreased   General Gait Details: steady ambulation  Stairs            Wheelchair Mobility    Modified Rankin (Stroke Patients Only)       Balance Overall balance assessment: Needs assistance Sitting-balance support: No upper extremity supported;Feet supported Sitting balance-Leahy Scale: Normal Sitting balance - Comments: steady sitting reaching outside BOS   Standing balance support: No upper extremity supported;During functional activity Standing balance-Leahy Scale: Good Standing balance comment: no loss of balance with ambulation and head turns R/L/up/down, increasing/decreasing speed, and turning 180 degrees and stopping                             Pertinent Vitals/Pain Pain Assessment: No/denies pain Vitals (HR and O2 on room air) stable and WFL throughout treatment session.    Home Living Family/patient expects to be discharged to:: Private residence Living Arrangements: Children (pt's daughter) Available Help at Discharge: Family Type of Home: House Home Access: Level entry     Home Layout: One level Home Equipment: Grab bars - tub/shower      Prior Function Level of Independence: Independent         Comments: No h/o recent falls.     Hand Dominance        Extremity/Trunk Assessment   Upper Extremity Assessment Upper Extremity Assessment: Generalized weakness    Lower Extremity Assessment Lower Extremity Assessment: Generalized weakness    Cervical / Trunk Assessment Cervical / Trunk Assessment: Normal  Communication   Communication: No difficulties  Cognition Arousal/Alertness: Awake/alert Behavior During Therapy: WFL for tasks assessed/performed Overall Cognitive  Status: Within Functional Limits for tasks assessed                                        General Comments  Nursing cleared pt for participation in physical therapy.  Pt agreeable to PT session.    Exercises     Assessment/Plan    PT  Assessment Patient needs continued PT services  PT Problem List Decreased strength;Decreased balance;Decreased activity tolerance;Decreased mobility       PT Treatment Interventions DME instruction;Gait training;Functional mobility training;Therapeutic activities;Therapeutic exercise;Balance training;Patient/family education    PT Goals (Current goals can be found in the Care Plan section)  Acute Rehab PT Goals Patient Stated Goal: to go home PT Goal Formulation: With patient Time For Goal Achievement: 01/17/21 Potential to Achieve Goals: Good    Frequency Min 2X/week   Barriers to discharge        Co-evaluation               AM-PAC PT "6 Clicks" Mobility  Outcome Measure Help needed turning from your back to your side while in a flat bed without using bedrails?: None Help needed moving from lying on your back to sitting on the side of a flat bed without using bedrails?: None Help needed moving to and from a bed to a chair (including a wheelchair)?: None Help needed standing up from a chair using your arms (e.g., wheelchair or bedside chair)?: None Help needed to walk in hospital room?: A Little Help needed climbing 3-5 steps with a railing? : A Little 6 Click Score: 22    End of Session Equipment Utilized During Treatment: Gait belt Activity Tolerance: Patient tolerated treatment well Patient left: in bed;with call bell/phone within reach;with bed alarm set;with family/visitor present Nurse Communication: Mobility status;Precautions PT Visit Diagnosis: Muscle weakness (generalized) (M62.81)    Time: 1353-1410 PT Time Calculation (min) (ACUTE ONLY): 17 min   Charges:   PT Evaluation $PT Eval Low Complexity: 1 Low PT Treatments $Therapeutic Exercise: 8-22 mins       Leitha Bleak, PT 01/03/21, 2:27 PM

## 2021-01-03 NOTE — Progress Notes (Signed)
[facility]  ROUNDING NOTE   Subjective:  [name] is a 75 y.o.  male with  Past medical history of hypertension, MI, CKD, CAD, and diabetes. He presents to the hospital with weakness and slurred speech. He states his daughter noticed this recommended he come get check out.   Patient seen resting in bed Daughter at bedside Tolerating meals brought in by family Says he has been OOB to bathroom Per daughter, therapy will come and walk him   Objective:  Vital signs in last 24 hours:  Temp:  [98 F (36.7 C)-99.3 F (37.4 C)] 98.6 F (37 C) (06/09 0756) Pulse Rate:  [75-83] 79 (06/09 0756) Resp:  [16-20] 16 (06/09 0756) BP: (126-159)/(71-77) 147/74 (06/09 0756) SpO2:  [93 %-97 %] 96 % (06/09 0756)  Weight change:  Filed Weights   12/28/20 1143  Weight: 77.1 kg    Intake/Output: I/O last 3 completed shifts: In: 4078.9 [P.O.:714; I.V.:3364.9] Out: 1700 [Urine:1700]   Intake/Output this shift:  No intake/output data recorded.  Physical Exam: General: NAD, resting in bed  Head: Normocephalic, atraumatic. Moist oral mucosal membranes  Eyes: Anicteric  Lungs:  Clear to auscultation, normal effort  Heart: Regular rate and rhythm  Abdomen:  Soft, nontender  Extremities:  no peripheral edema.  Neurologic: Alert, moving all four extremities  Skin: No lesions    Basic Metabolic Panel: Recent Labs  Lab 12/30/20 0845 12/31/20 1031 01/01/21 0447 01/02/21 0443 01/03/21 0822  NA 134* 136 139 139 142  K 3.4* 3.3* 3.4* 3.3* 3.5  CL 92* 90* 87* 93* 101  CO2 28 34* 36* 37* 31  GLUCOSE 145* 198* 121* 152* 132*  BUN 81* 78* 70* 59* 46*  CREATININE 6.27* 5.97* 5.81* 5.18* 4.54*  CALCIUM 8.3* 8.2* 8.5* 8.1* 8.2*     Liver Function Tests: Recent Labs  Lab 12/28/20 1151 01/01/21 0447  AST 84* 74*  ALT 56* 61*  ALKPHOS 83 90  BILITOT 1.0 1.2  PROT 6.8 5.7*  ALBUMIN 3.4* 2.7*    No results for input(s): LIPASE, AMYLASE in the last 168  hours. No results for input(s): AMMONIA in the last 168 hours.  CBC: Recent Labs  Lab 12/28/20 1151 12/29/20 0533 12/30/20 0845 01/01/21 0447  WBC 19.7* 14.8* 13.6* 12.1*  NEUTROABS 16.6*  --   --   --   HGB 9.2* 8.6* 9.1* 9.2*  HCT 26.8* 24.2* 25.5* 26.5*  MCV 83.5 79.9* 80.2 82.6  PLT 192 168 189 250     Cardiac Enzymes: Recent Labs  Lab 12/29/20 0533  CKTOTAL 84     BNP: Invalid input(s): POCBNP  CBG: Recent Labs  Lab 01/02/21 0819 01/02/21 1147 01/02/21 1611 01/02/21 2148 01/03/21 0757  GLUCAP 143* 238* 228* 175* 144*     Microbiology: Results for orders placed or performed during the hospital encounter of 12/28/20  Resp Panel by RT-PCR (Flu A&B, Covid) Nasopharyngeal Swab     Status: None   Collection Time: 12/28/20  2:40 PM   Specimen: Nasopharyngeal Swab; Nasopharyngeal(NP) swabs in vial transport medium  Result Value Ref Range Status   SARS Coronavirus 2 by RT PCR NEGATIVE NEGATIVE Final    Comment: (NOTE) SARS-CoV-2 target nucleic acids are NOT DETECTED.  The SARS-CoV-2 RNA is generally detectable in upper respiratory specimens during the acute phase of infection. The lowest concentration of SARS-CoV-2 viral copies this assay can detect is 138 copies/mL. A negative result does not preclude SARS-Cov-2 infection and should not be used as  the sole basis for treatment or other patient management decisions. A negative result may occur with  improper specimen collection/handling, submission of specimen other than nasopharyngeal swab, presence of viral mutation(s) within the areas targeted by this assay, and inadequate number of viral copies(<138 copies/mL). A negative result must be combined with clinical observations, patient history, and epidemiological information. The expected result is Negative.  Fact Sheet for Patients:  EntrepreneurPulse.com.au  Fact Sheet for Healthcare Providers:   IncredibleEmployment.be  This test is no t yet approved or cleared by the [place] FDA and  has been authorized for detection and/or diagnosis of SARS-CoV-2 by FDA under an Emergency Use Authorization (EUA). This EUA will remain  in effect (meaning this test can be used) for the duration of the COVID-19 declaration under Section 564(b)(1) of the Act, 21 U.S.C.section 360bbb-3(b)(1), unless the authorization is terminated  or revoked sooner.       Influenza A by PCR NEGATIVE NEGATIVE Final   Influenza B by PCR NEGATIVE NEGATIVE Final    Comment: (NOTE) The Xpert Xpress SARS-CoV-2/FLU/RSV plus assay is intended as an aid in the diagnosis of influenza from Nasopharyngeal swab specimens and should not be used as a sole basis for treatment. Nasal washings and aspirates are unacceptable for Xpert Xpress SARS-CoV-2/FLU/RSV testing.  Fact Sheet for Patients: EntrepreneurPulse.com.au  Fact Sheet for Healthcare Providers: IncredibleEmployment.be  This test is not yet approved or cleared by the [place] FDA and has been authorized for detection and/or diagnosis of SARS-CoV-2 by FDA under an Emergency Use Authorization (EUA). This EUA will remain in effect (meaning this test can be used) for the duration of the COVID-19 declaration under Section 564(b)(1) of the Act, 21 U.S.C. section 360bbb-3(b)(1), unless the authorization is terminated or revoked.  Performed at [facility], [address] [place], [place] [address]     Coagulation Studies: No results for input(s): LABPROT, INR in the last 72 hours.  Urinalysis: No results for input(s): COLORURINE, LABSPEC, PHURINE, GLUCOSEU, HGBUR, BILIRUBINUR, KETONESUR, PROTEINUR, UROBILINOGEN, NITRITE, LEUKOCYTESUR in the last 72 hours.  Invalid input(s): APPERANCEUR    Imaging: No results found.   Medications:    sodium chloride 100 mL/hr at 01/03/21 1914     allopurinol  100 mg Oral Daily   ferrous sulfate  325 mg Oral Q breakfast   And   vitamin C  250 mg Oral Q breakfast   aspirin EC  81 mg Oral Daily   atorvastatin  40 mg Oral QHS   calcium-vitamin D  1 tablet Oral Q breakfast   clopidogrel  75 mg Oral Daily   feeding supplement  1 Container Oral BID BM   heparin  5,000 Units Subcutaneous Q8H   insulin aspart  0-9 Units Subcutaneous TID WC   metoprolol succinate  50 mg Oral BID   timolol  1 drop Left Eye Daily   acetaminophen **OR** acetaminophen, ondansetron **OR** ondansetron (ZOFRAN) IV  Assessment/ Plan:  [name] is a 75 y.o.  male with past medical history of hypertension, MI, CKD, CAD, and diabetes. He presents to the hospital with weakness and slurred speech. He states his daughter noticed this recommended he come get check out.   1. 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  Acute kidney injury likely secondary to prerenal azotemia Based on recent history of poor appetite and diarrhea vs hypotension No acute need for dialysis at this time UOP 600  ml Renal function improved to 4.5 Continue IVF-NS @100ml /hr Patient followed by [facility] nephrology, cleared to discharge with follow up in a week with outpatient nephrology  Lab Results  Component Value Date   CREATININE 4.54 (H) 01/03/2021   CREATININE 5.18 (H) 01/02/2021   CREATININE 5.81 (H) 01/01/2021    Intake/Output Summary (Last 24 hours) at 01/03/2021 1039 Last data filed at 01/03/2021 0410 Gross per 24 hour  Intake 2813.43 ml  Output 600 ml  Net 2213.43 ml    2. 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   Most recent hemoglobin A1c is 6.4 on 09/07/20.  Hypoglycemic in ED to 49.  Improved control   3. Hypertension: baseline 140s/60s Home regimen includes Amlodipine and metoprolol. BP at baseline range  4. Hypokalemia Currently 3.5 Will monitor  and improve with appetite    LOS: 6 Coleman Kalas 6/9/202210:39 AM

## 2021-01-03 NOTE — Progress Notes (Addendum)
Pt telemetry on, but not working. Telemetry box changed from X2 to a portable MX40 box with no change. Tele stickers changed on pt and leads cleaned. Still no change. Troubleshooting done with CN Brandi, NT Debbie, and AD Brooks. Still cannot get telemetry to work. MD made aware of situation earlier and wants to continue pt on telemetry. Will continue to try to fix problem. Pt on standby with CCMD right now. Pt sitting in bed in no acute distress. No complaints of any pain. A&Ox4.

## 2021-01-04 ENCOUNTER — Inpatient Hospital Stay: Payer: Medicare PPO

## 2021-01-04 LAB — CBC
HCT: 25.3 % — ABNORMAL LOW (ref 39.0–52.0)
Hemoglobin: 8.5 g/dL — ABNORMAL LOW (ref 13.0–17.0)
MCH: 28.5 pg (ref 26.0–34.0)
MCHC: 33.6 g/dL (ref 30.0–36.0)
MCV: 84.9 fL (ref 80.0–100.0)
Platelets: 237 10*3/uL (ref 150–400)
RBC: 2.98 MIL/uL — ABNORMAL LOW (ref 4.22–5.81)
RDW: 14.7 % (ref 11.5–15.5)
WBC: 8.7 10*3/uL (ref 4.0–10.5)
nRBC: 0 % (ref 0.0–0.2)

## 2021-01-04 LAB — COMPREHENSIVE METABOLIC PANEL
ALT: 142 U/L — ABNORMAL HIGH (ref 0–44)
AST: 155 U/L — ABNORMAL HIGH (ref 15–41)
Albumin: 2.5 g/dL — ABNORMAL LOW (ref 3.5–5.0)
Alkaline Phosphatase: 82 U/L (ref 38–126)
Anion gap: 9 (ref 5–15)
BUN: 38 mg/dL — ABNORMAL HIGH (ref 8–23)
CO2: 30 mmol/L (ref 22–32)
Calcium: 7.8 mg/dL — ABNORMAL LOW (ref 8.9–10.3)
Chloride: 102 mmol/L (ref 98–111)
Creatinine, Ser: 4.06 mg/dL — ABNORMAL HIGH (ref 0.61–1.24)
GFR, Estimated: 15 mL/min — ABNORMAL LOW (ref >60)
Glucose, Bld: 165 mg/dL — ABNORMAL HIGH (ref 70–99)
Potassium: 3.3 mmol/L — ABNORMAL LOW (ref 3.5–5.1)
Sodium: 141 mmol/L (ref 135–145)
Total Bilirubin: 1.1 mg/dL (ref 0.3–1.2)
Total Protein: 5.4 g/dL — ABNORMAL LOW (ref 6.5–8.1)

## 2021-01-04 LAB — GLUCOSE, CAPILLARY
Glucose-Capillary: 143 mg/dL — ABNORMAL HIGH (ref 70–99)
Glucose-Capillary: 149 mg/dL — ABNORMAL HIGH (ref 70–99)
Glucose-Capillary: 172 mg/dL — ABNORMAL HIGH (ref 70–99)

## 2021-01-04 LAB — LIPASE, BLOOD: Lipase: 27 U/L (ref 11–51)

## 2021-01-04 MED ORDER — POTASSIUM CHLORIDE CRYS ER 20 MEQ PO TBCR: 20.0000 meq | EXTENDED_RELEASE_TABLET | Freq: Once | ORAL | Status: DC

## 2021-01-04 MED ORDER — TECHNETIUM TC 99M MEBROFENIN IV KIT
5.6100 | PACK | Freq: Once | INTRAVENOUS | Status: AC | PRN
  Administered 2021-01-04: 12:00:00 5.61 via INTRAVENOUS

## 2021-01-04 MED ORDER — LACTATED RINGERS IV SOLN: INTRAVENOUS | Status: DC

## 2021-01-04 MED ORDER — MORPHINE SULFATE (PF) 4 MG/ML IV SOLN
3.0000 mg | Freq: Once | INTRAVENOUS | Status: AC
  Administered 2021-01-04: 3 mg via INTRAVENOUS
  Filled 2021-01-04: qty 1

## 2021-01-04 NOTE — Progress Notes (Signed)
[facility]  ROUNDING NOTE   Subjective:  [name] is a 75 y.o.  male with  Past medical history of hypertension, MI, CKD, CAD, and diabetes. He presents to the hospital with weakness and slurred speech. He states his daughter noticed this recommended he come get check out.   Patient seen resting in bed Daughter at bedside Feels that working with physical therapy went well yesterday Appetite has improved with meals brought in by family Feels stronger   Objective:  Vital signs in last 24 hours:  Temp:  [98.6 F (37 C)-99.1 F (37.3 C)] 98.8 F (37.1 C) (06/10 0830) Pulse Rate:  [72-79] 72 (06/10 0830) Resp:  [16-20] 18 (06/10 0830) BP: (136-164)/(68-82) 160/75 (06/10 0830) SpO2:  [97 %-98 %] 97 % (06/10 0830)  Weight change:  Filed Weights   12/28/20 1143  Weight: 77.1 kg    Intake/Output: I/O last 3 completed shifts: In: 3767.2 [P.O.:650; I.V.:3117.2] Out: 2075 [Urine:2075]   Intake/Output this shift:  Total I/O In: -  Out: 500 [Urine:500]  Physical Exam: General: NAD, resting in bed  Head: Normocephalic, atraumatic. Moist oral mucosal membranes  Eyes: Anicteric  Lungs:  Clear to auscultation, normal effort  Heart: Regular rate and rhythm  Abdomen:  Soft, nontender  Extremities:  no peripheral edema.  Neurologic: Alert, moving all four extremities  Skin: No lesions    Basic Metabolic Panel: Recent Labs  Lab 01/01/21 0447 01/02/21 0443 01/03/21 0822 01/03/21 1625 01/04/21 0430  NA 139 139 142 138 141  K 3.4* 3.3* 3.5 3.7 3.3*  CL 87* 93* 101 98 102  CO2 36* 37* 31 31 30   GLUCOSE 121* 152* 132* 351* 165*  BUN 70* 59* 46* 45* 38*  CREATININE 5.81* 5.18* 4.54* 4.36* 4.06*  CALCIUM 8.5* 8.1* 8.2* 7.8* 7.8*     Liver Function Tests: Recent Labs  Lab 12/28/20 1151 01/01/21 0447 01/03/21 1625 01/04/21 0430  AST 84* 74* 189* 155*  ALT 56* 61* 145* 142*  ALKPHOS 83 90 88 82  BILITOT 1.0 1.2 1.1 1.1  PROT 6.8 5.7* 5.4*  5.4*  ALBUMIN 3.4* 2.7* 2.6* 2.5*    Recent Labs  Lab 01/04/21 0430  LIPASE 27   No results for input(s): AMMONIA in the last 168 hours.  CBC: Recent Labs  Lab 12/28/20 1151 12/29/20 0533 12/30/20 0845 01/01/21 0447 01/03/21 1625 01/04/21 0430  WBC 19.7* 14.8* 13.6* 12.1* 8.4 8.7  NEUTROABS 16.6*  --   --   --  6.1  --   HGB 9.2* 8.6* 9.1* 9.2* 8.5* 8.5*  HCT 26.8* 24.2* 25.5* 26.5* 25.5* 25.3*  MCV 83.5 79.9* 80.2 82.6 85.6 84.9  PLT 192 168 189 250 234 237     Cardiac Enzymes: Recent Labs  Lab 12/29/20 0533  CKTOTAL 84     BNP: Invalid input(s): POCBNP  CBG: Recent Labs  Lab 01/03/21 0757 01/03/21 1126 01/03/21 1618 01/03/21 2132 01/04/21 0733  GLUCAP 144* 243* 337* 223* 143*     Microbiology: Results for orders placed or performed during the hospital encounter of 12/28/20  Resp Panel by RT-PCR (Flu A&B, Covid) Nasopharyngeal Swab     Status: None   Collection Time: 12/28/20  2:40 PM   Specimen: Nasopharyngeal Swab; Nasopharyngeal(NP) swabs in vial transport medium  Result Value Ref Range Status   SARS Coronavirus 2 by RT PCR NEGATIVE NEGATIVE Final    Comment: (NOTE) SARS-CoV-2 target nucleic acids are NOT DETECTED.  The SARS-CoV-2 RNA is generally detectable in  upper respiratory specimens during the acute phase of infection. The lowest concentration of SARS-CoV-2 viral copies this assay can detect is 138 copies/mL. A negative result does not preclude SARS-Cov-2 infection and should not be used as the sole basis for treatment or other patient management decisions. A negative result may occur with  improper specimen collection/handling, submission of specimen other than nasopharyngeal swab, presence of viral mutation(s) within the areas targeted by this assay, and inadequate number of viral copies(<138 copies/mL). A negative result must be combined with clinical observations, patient history, and epidemiological information. The expected  result is Negative.  Fact Sheet for Patients:  EntrepreneurPulse.com.au  Fact Sheet for Healthcare Providers:  IncredibleEmployment.be  This test is no t yet approved or cleared by the [place] FDA and  has been authorized for detection and/or diagnosis of SARS-CoV-2 by FDA under an Emergency Use Authorization (EUA). This EUA will remain  in effect (meaning this test can be used) for the duration of the COVID-19 declaration under Section 564(b)(1) of the Act, 21 U.S.C.section 360bbb-3(b)(1), unless the authorization is terminated  or revoked sooner.       Influenza A by PCR NEGATIVE NEGATIVE Final   Influenza B by PCR NEGATIVE NEGATIVE Final    Comment: (NOTE) The Xpert Xpress SARS-CoV-2/FLU/RSV plus assay is intended as an aid in the diagnosis of influenza from Nasopharyngeal swab specimens and should not be used as a sole basis for treatment. Nasal washings and aspirates are unacceptable for Xpert Xpress SARS-CoV-2/FLU/RSV testing.  Fact Sheet for Patients: EntrepreneurPulse.com.au  Fact Sheet for Healthcare Providers: IncredibleEmployment.be  This test is not yet approved or cleared by the [place] FDA and has been authorized for detection and/or diagnosis of SARS-CoV-2 by FDA under an Emergency Use Authorization (EUA). This EUA will remain in effect (meaning this test can be used) for the duration of the COVID-19 declaration under Section 564(b)(1) of the Act, 21 U.S.C. section 360bbb-3(b)(1), unless the authorization is terminated or revoked.  Performed at [facility], [address] [place], [place] [address]     Coagulation Studies: No results for input(s): LABPROT, INR in the last 72 hours.  Urinalysis: No results for input(s): COLORURINE, LABSPEC, PHURINE, GLUCOSEU, HGBUR, BILIRUBINUR, KETONESUR, PROTEINUR, UROBILINOGEN, NITRITE, LEUKOCYTESUR in the last 72  hours.  Invalid input(s): APPERANCEUR    Imaging: No results found.   Medications:    sodium chloride 100 mL/hr at 01/04/21 0347    allopurinol  100 mg Oral Daily   ferrous sulfate  325 mg Oral Q breakfast   And   vitamin C  250 mg Oral Q breakfast   aspirin EC  81 mg Oral Daily   calcium-vitamin D  1 tablet Oral Q breakfast   clopidogrel  75 mg Oral Daily   feeding supplement  1 Container Oral BID BM   heparin  5,000 Units Subcutaneous Q8H   insulin aspart  0-9 Units Subcutaneous TID WC   metoprolol succinate  50 mg Oral BID   potassium chloride  20 mEq Oral Once   timolol  1 drop Left Eye Daily   acetaminophen **OR** acetaminophen, ondansetron **OR** ondansetron (ZOFRAN) IV  Assessment/ Plan:  [name] is a 75 y.o.  male with past medical history of hypertension, MI, CKD, CAD, and diabetes. He presents to the hospital with weakness and slurred speech. He states his daughter noticed this recommended he come get check out.   1. Acute Kidney Injury on chronic kidney disease stage 4 with baseline  creatinine 2.74 and GFR of 22 on 11/15/20.  Acute kidney injury likely secondary to prerenal azotemia Based on recent history of poor appetite and diarrhea vs hypotension No acute need for dialysis at this time UOP 1.4L Renal function continues to improve Continue IVF-NS @100ml /hr Patient followed by [facility] nephrology, cleared to discharge with follow up in a week with outpatient nephrology  Lab Results  Component Value Date   CREATININE 4.06 (H) 01/04/2021   CREATININE 4.36 (H) 01/03/2021   CREATININE 4.54 (H) 01/03/2021    Intake/Output Summary (Last 24 hours) at 01/04/2021 0926 Last data filed at 01/04/2021 0800 Gross per 24 hour  Intake 2536.75 ml  Output 1975 ml  Net 561.75 ml    2. 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   Most recent hemoglobin A1c is 6.4 on 09/07/20.  Hypoglycemic in ED to 49.  Stable   3.  Hypertension: baseline 140s/60s Home regimen includes Amlodipine and metoprolol. BP at baseline range  4. Hypokalemia Currently 3.3 Will monitor and improve with appetite    LOS: 7 Nathaniel Nguyen 6/10/20229:26 AM

## 2021-01-04 NOTE — Progress Notes (Signed)
Holding am meds until after ordered scan.  Nathaniel Nguyen in nuclear medicine told me they prefer pt only has a sip or 2 of water prior to scan.

## 2021-01-04 NOTE — Care Management Important Message (Signed)
Important Message  Patient Details  Name: Nathaniel Nguyen MRN: [id number] Date of Birth: 04/14/1946   Medicare Important Message Given:  Yes     [name] 01/04/2021, 10:44 AM

## 2021-01-04 NOTE — Progress Notes (Signed)
PROGRESS NOTE  Nathaniel Nguyen TMH:[id number] DOB: August 26, 1945 DOA: 12/28/2020 PCP: Tracie Harrier, MD  HPI/Recap of past 24 hours: Nathaniel Nguyen is a 75 y.o. 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  In the ED, lab work showed creatinine of 6.16, baseline creatinine 2.74 as of April 2022.  CT head and MRI brain were nonacute.  Patient admitted with acute kidney injury on CKD stage IV.    Incidentally found, asymptomatic Gallbladder wall thickening on renal US with noted acute transaminitis of unclear etiology.  Statin was held with noted improvement of LFTs' trend.  HIDA scan done on 01/04/21 with no evidence of acute cholecystitis.  Continue to hold home statin.  Repeating CMP outpatient on Wed 01/09/21 to reassess renal and liver function.   01/04/2021: Patient would like to return to home tomorrow 01/05/21.  Will continue IV fluid overnight and plan to dc to home tomorrow.  Updated his daughters regarding this plan.  Patient will follow up with [facility] nephrology and will continue to avoid nephrotoxic and hepatotoxic agents.  He agrees to stay hydrated.    Assessment/Plan: Principal Problem:   AKI (acute kidney injury) ([place]) Active Problems:   CKD stage 3 due to type 2 diabetes mellitus ([place])   Type 2 diabetes mellitus with hypoglycemia without coma ([facility])   Coronary artery disease   Hypertension   Transaminitis  Non oliguric AKI on CKD IV likely prerenal in the setting of dehydration and poor oral intake: Creatinine is downtrending, 4.0 from 4.5  Baseline creatinine 2.0   Continue to avoid nephrotoxic agents, dehydration and hypotension.   1.4 L recorded in last 24 H Continue to closely monitor urine output.   CMP AM    Gallbladder thickening seen on [place] HIDA scan negative for acute cholecystitis  Transaminitis, unclear etiology. Continue to hold off home LIpitor US shows apparent thickening of the gallbladder wall w/ pericholecystic fluid. No gallstones appreciable. Concern for acalculous cholecystitis, ruled out with HIDA scan 01/04/21.  Patient denies any abdominal pain.  No nausea.   DM2, well controlled: Well controlled w/ HbA1c 6.4.  Continue on SSI w/ accuchecks   Hx of CAD:  Prior to admission on metoprolol, plavix & aspirin Denies any anginal symptoms Statin on hold   HTN: BP is at goal.   Continue home dose of metoprolol.  Monitor vitals   Hypokalemia: Serum potassium 3.3. Judicious with replacement due to advance AKI on CKD 4. Repeat ion the AM     DVT prophylaxis: heparin SQ 3 times daily. Code Status: full  Family Communication: discussed pt's care w/ pt's family at bedside and answered their questions  Disposition Plan: d/c back home on 01/05/21   Level of care: Med-Surg    Status is: Inpatient   Remains inpatient appropriate because:IV treatments appropriate due to intensity of illness or inability to take PO and Inpatient level of care appropriate due to severity of illness     Dispo: The patient is from: Home              Anticipated d/c is to: Home possibly on 01/05/2021 if renal function continues to improve              Patient currently is not medically stable to d/c.  Difficult to place patient: unclear       Consultants:  nephro    Procedures:    Antimicrobials:    Objective: Vitals:   01/03/21 2008 01/03/21 2315 01/04/21 0344 01/04/21 0830  BP: (!) 164/81 (!) 160/82 (!) 160/81 (!) 160/75  Pulse: 74 79 78 72  Resp: 16 20 16 18   Temp: 99 F (37.2 C) 98.7 F (37.1 C) 99.1 F (37.3 C) 98.8 F (37.1 C)  TempSrc: Oral Oral Oral   SpO2: 98% 97% 97% 97%  Weight:      Height:        Intake/Output Summary (Last 24 hours) at 01/04/2021  1649 Last data filed at 01/04/2021 0800 Gross per 24 hour  Intake 967.21 ml  Output 1975 ml  Net -1007.79 ml   Filed Weights   12/28/20 1143  Weight: 77.1 kg    Exam:  General: 75 y.o. year-old male well-developed well-nourished in no acute distress.  He is alert and oriented x3.   Cardiovascular: Regular rate and rhythm no rubs or gallops.   Respiratory: Clear to auscultation no wheezes or rales.   Abdomen: Soft nontender normal bowel sounds present. Musculoskeletal: No lower extremity edema bilaterally.   Skin: No ulcerative lesions noted.   Psychiatry: Mood is appropriate for condition and setting.   Data Reviewed: CBC: Recent Labs  Lab 12/29/20 0533 12/30/20 0845 01/01/21 0447 01/03/21 1625 01/04/21 0430  WBC 14.8* 13.6* 12.1* 8.4 8.7  NEUTROABS  --   --   --  6.1  --   HGB 8.6* 9.1* 9.2* 8.5* 8.5*  HCT 24.2* 25.5* 26.5* 25.5* 25.3*  MCV 79.9* 80.2 82.6 85.6 84.9  PLT 168 189 250 234 151   Basic Metabolic Panel: Recent Labs  Lab 01/01/21 0447 01/02/21 0443 01/03/21 0822 01/03/21 1625 01/04/21 0430  NA 139 139 142 138 141  K 3.4* 3.3* 3.5 3.7 3.3*  CL 87* 93* 101 98 102  CO2 36* 37* 31 31 30   GLUCOSE 121* 152* 132* 351* 165*  BUN 70* 59* 46* 45* 38*  CREATININE 5.81* 5.18* 4.54* 4.36* 4.06*  CALCIUM 8.5* 8.1* 8.2* 7.8* 7.8*   GFR: Estimated Creatinine Clearance: 15 mL/min (A) (by C-G formula based on SCr of 4.06 mg/dL (H)). Liver Function Tests: Recent Labs  Lab 01/01/21 0447 01/03/21 1625 01/04/21 0430  AST 74* 189* 155*  ALT 61* 145* 142*  ALKPHOS 90 88 82  BILITOT 1.2 1.1 1.1  PROT 5.7* 5.4* 5.4*  ALBUMIN 2.7* 2.6* 2.5*   Recent Labs  Lab 01/04/21 0430  LIPASE 27   No results for input(s): AMMONIA in the last 168 hours. Coagulation Profile: No results for input(s): INR, PROTIME in the last 168 hours.  Cardiac Enzymes: Recent Labs  Lab 12/29/20 0533  CKTOTAL 84   BNP (last 3 results) No results for input(s): PROBNP in the last  8760 hours. HbA1C: No results for input(s): HGBA1C in the last 72 hours. CBG: Recent Labs  Lab 01/03/21 1126 01/03/21 1618 01/03/21 2132 01/04/21 0733 01/04/21 1610  GLUCAP 243* 337* 223* 143* 149*   Lipid Profile: No results for input(s): CHOL, HDL, LDLCALC, TRIG, CHOLHDL, LDLDIRECT in the last 72 hours. Thyroid Function Tests: No results for input(s): TSH, T4TOTAL, FREET4, T3FREE, THYROIDAB in the last 72 hours. Anemia Panel: No results for input(s): VITAMINB12, FOLATE, FERRITIN, TIBC, IRON, RETICCTPCT in the last 72 hours. Urine analysis:    Component Value Date/Time   COLORURINE YELLOW (A) 12/29/2020 1535   APPEARANCEUR HAZY (A)  12/29/2020 [place] 07/29/2013 0755   LABSPEC 1.006 12/29/2020 1535   LABSPEC 1.013 07/29/2013 0755   PHURINE 5.0 12/29/2020 1535   GLUCOSEU 50 (A) 12/29/2020 1535   GLUCOSEU 50 mg/dL 07/29/2013 0755   HGBUR MODERATE (A) 12/29/2020 1535   BILIRUBINUR NEGATIVE 12/29/2020 1535   BILIRUBINUR Negative 07/29/2013 [place] 12/29/2020 1535   PROTEINUR 30 (A) 12/29/2020 1535   NITRITE NEGATIVE 12/29/2020 1535   LEUKOCYTESUR NEGATIVE 12/29/2020 1535   LEUKOCYTESUR Negative 07/29/2013 0755   Sepsis Labs: @LABRCNTIP (procalcitonin:4,lacticidven:4)  ) Recent Results (from the past 240 hour(s))  Resp Panel by RT-PCR (Flu A&B, Covid) Nasopharyngeal Swab     Status: None   Collection Time: 12/28/20  2:40 PM   Specimen: Nasopharyngeal Swab; Nasopharyngeal(NP) swabs in vial transport medium  Result Value Ref Range Status   SARS Coronavirus 2 by RT PCR NEGATIVE NEGATIVE Final    Comment: (NOTE) SARS-CoV-2 target nucleic acids are NOT DETECTED.  The SARS-CoV-2 RNA is generally detectable in upper respiratory specimens during the acute phase of infection. The lowest concentration of SARS-CoV-2 viral copies this assay can detect is 138 copies/mL. A negative result does not preclude SARS-Cov-2 infection and should not be  used as the sole basis for treatment or other patient management decisions. A negative result may occur with  improper specimen collection/handling, submission of specimen other than nasopharyngeal swab, presence of viral mutation(s) within the areas targeted by this assay, and inadequate number of viral copies(<138 copies/mL). A negative result must be combined with clinical observations, patient history, and epidemiological information. The expected result is Negative.  Fact Sheet for Patients:  EntrepreneurPulse.com.au  Fact Sheet for Healthcare Providers:  IncredibleEmployment.be  This test is no t yet approved or cleared by the [place] FDA and  has been authorized for detection and/or diagnosis of SARS-CoV-2 by FDA under an Emergency Use Authorization (EUA). This EUA will remain  in effect (meaning this test can be used) for the duration of the COVID-19 declaration under Section 564(b)(1) of the Act, 21 U.S.C.section 360bbb-3(b)(1), unless the authorization is terminated  or revoked sooner.       Influenza A by PCR NEGATIVE NEGATIVE Final   Influenza B by PCR NEGATIVE NEGATIVE Final    Comment: (NOTE) The Xpert Xpress SARS-CoV-2/FLU/RSV plus assay is intended as an aid in the diagnosis of influenza from Nasopharyngeal swab specimens and should not be used as a sole basis for treatment. Nasal washings and aspirates are unacceptable for Xpert Xpress SARS-CoV-2/FLU/RSV testing.  Fact Sheet for Patients: EntrepreneurPulse.com.au  Fact Sheet for Healthcare Providers: IncredibleEmployment.be  This test is not yet approved or cleared by the [place] FDA and has been authorized for detection and/or diagnosis of SARS-CoV-2 by FDA under an Emergency Use Authorization (EUA). This EUA will remain in effect (meaning this test can be used) for the duration of the COVID-19 declaration under Section  564(b)(1) of the Act, 21 U.S.C. section 360bbb-3(b)(1), unless the authorization is terminated or revoked.  Performed at [facility], [address], [place], [place] [address]       Studies: NM Hepatobiliary Liver Func  Result Date: 01/04/2021 CLINICAL DATA:  Gallbladder wall thickening on ultrasound. EXAM: NUCLEAR MEDICINE HEPATOBILIARY IMAGING TECHNIQUE: Sequential images of the abdomen were obtained out to 90 minutes following intravenous administration of radiopharmaceutical. RADIOPHARMACEUTICALS:  5.6 mCi Tc-45m  Choletec IV COMPARISON:  Ultrasound 12/28/2020 FINDINGS: Prompt clearance radiotracer from blood pool and homogeneous uptake in liver. Counts are  evident in the small bowel by 15 minutes. Gallbladder fails to fill at 90 minutes. 3 mg of IV morphine were administered to augment filling of the gallbladder. Post morphine injection the gallbladder fills. Ejection fraction cannot be calculated once morphine has been administered. IMPRESSION: 1. Patent cystic duct and common bile duct is inconsistent with acute cholecystitis. 2. Gallbladder ejection fraction could not be calculated as above. Electronically Signed   By: Suzy Bouchard M.D.   On: 01/04/2021 15:05    Scheduled Meds:  allopurinol  100 mg Oral Daily   ferrous sulfate  325 mg Oral Q breakfast   And   vitamin C  250 mg Oral Q breakfast   aspirin EC  81 mg Oral Daily   calcium-vitamin D  1 tablet Oral Q breakfast   clopidogrel  75 mg Oral Daily   feeding supplement  1 Container Oral BID BM   heparin  5,000 Units Subcutaneous Q8H   insulin aspart  0-9 Units Subcutaneous TID WC   metoprolol succinate  50 mg Oral BID   potassium chloride  20 mEq Oral Once   timolol  1 drop Left Eye Daily    Continuous Infusions:  lactated ringers 100 mL/hr at 01/04/21 1644     LOS: 7 days     Kayleen Memos, MD [facility] Pager [phone]  If 7PM-7AM, please contact  night-coverage [email or web address] Password [facility] 01/04/2021, 4:49 PM

## 2021-01-04 NOTE — Progress Notes (Signed)
Inpatient Diabetes Program Recommendations  AACE/ADA: New Consensus Statement on Inpatient Glycemic Control (2015)  Target Ranges:  Prepandial:   less than 140 mg/dL      Peak postprandial:   less than 180 mg/dL (1-2 hours)      Critically ill patients:  140 - 180 mg/dL   Lab Results  Component Value Date   GLUCAP 223 (H) 01/03/2021   HGBA1C 7.2 (H) 12/31/2020    Review of Glycemic Control Results for Nathaniel Nguyen, Nathaniel Nguyen (MRN [id number]) as of 01/04/2021 06:55  Ref. Range 01/03/2021 07:57 01/03/2021 11:26 01/03/2021 16:18 01/03/2021 21:32  Glucose-Capillary Latest Ref Range: 70 - 99 mg/dL 144 (H) 243 (H) 337 (H) 223 (H)     Inpatient Diabetes Program Recommendations:    Please consider:  Novolog 2 units TID with meals if consumes at leat 50%.  Will continue to follow while inpatient.  Thank you, Reche Dixon, RN, BSN Diabetes Coordinator Inpatient Diabetes Program [phone] (team pager from 8a-5p)

## 2021-01-05 DIAGNOSIS — N189 Chronic kidney disease, unspecified: Secondary | ICD-10-CM

## 2021-01-05 DIAGNOSIS — D638 Anemia in other chronic diseases classified elsewhere: Secondary | ICD-10-CM

## 2021-01-05 DIAGNOSIS — E785 Hyperlipidemia, unspecified: Secondary | ICD-10-CM

## 2021-01-05 DIAGNOSIS — E1169 Type 2 diabetes mellitus with other specified complication: Secondary | ICD-10-CM

## 2021-01-05 LAB — CBC
HCT: 26.5 % — ABNORMAL LOW (ref 39.0–52.0)
Hemoglobin: 8.7 g/dL — ABNORMAL LOW (ref 13.0–17.0)
MCH: 28.2 pg (ref 26.0–34.0)
MCHC: 32.8 g/dL (ref 30.0–36.0)
MCV: 86 fL (ref 80.0–100.0)
Platelets: 228 10*3/uL (ref 150–400)
RBC: 3.08 MIL/uL — ABNORMAL LOW (ref 4.22–5.81)
RDW: 14.7 % (ref 11.5–15.5)
WBC: 7.1 10*3/uL (ref 4.0–10.5)
nRBC: 0 % (ref 0.0–0.2)

## 2021-01-05 LAB — COMPREHENSIVE METABOLIC PANEL
ALT: 131 U/L — ABNORMAL HIGH (ref 0–44)
AST: 120 U/L — ABNORMAL HIGH (ref 15–41)
Albumin: 2.5 g/dL — ABNORMAL LOW (ref 3.5–5.0)
Alkaline Phosphatase: 90 U/L (ref 38–126)
Anion gap: 10 (ref 5–15)
BUN: 32 mg/dL — ABNORMAL HIGH (ref 8–23)
CO2: 29 mmol/L (ref 22–32)
Calcium: 7.9 mg/dL — ABNORMAL LOW (ref 8.9–10.3)
Chloride: 103 mmol/L (ref 98–111)
Creatinine, Ser: 3.64 mg/dL — ABNORMAL HIGH (ref 0.61–1.24)
GFR, Estimated: 17 mL/min — ABNORMAL LOW (ref >60)
Glucose, Bld: 143 mg/dL — ABNORMAL HIGH (ref 70–99)
Potassium: 3.6 mmol/L (ref 3.5–5.1)
Sodium: 142 mmol/L (ref 135–145)
Total Bilirubin: 1.1 mg/dL (ref 0.3–1.2)
Total Protein: 5.4 g/dL — ABNORMAL LOW (ref 6.5–8.1)

## 2021-01-05 MED ORDER — NYSTATIN 100000 UNIT/ML MT SUSP: 5.0000 mL | Freq: Four times a day (QID) | OROMUCOSAL | 0 refills | Status: AC

## 2021-01-05 MED ORDER — NYSTATIN 100000 UNIT/ML MT SUSP: 5.0000 mL | Freq: Four times a day (QID) | OROMUCOSAL | Status: DC

## 2021-01-05 NOTE — Plan of Care (Signed)
  Problem: Education: Goal: Knowledge of General Education information will improve Description: Including pain rating scale, medication(s)/side effects and non-pharmacologic comfort measures Outcome: Adequate for Discharge   Problem: Health Behavior/Discharge Planning: Goal: Ability to manage health-related needs will improve Outcome: Adequate for Discharge   Problem: Clinical Measurements: Goal: Ability to maintain clinical measurements within normal limits will improve Outcome: Adequate for Discharge Goal: Will remain free from infection Outcome: Adequate for Discharge Goal: Diagnostic test results will improve Outcome: Adequate for Discharge Goal: Respiratory complications will improve Outcome: Adequate for Discharge Goal: Cardiovascular complication will be avoided Outcome: Adequate for Discharge   Problem: Activity: Goal: Risk for activity intolerance will decrease Outcome: Adequate for Discharge   Problem: Nutrition: Goal: Adequate nutrition will be maintained Outcome: Adequate for Discharge   Problem: Coping: Goal: Level of anxiety will decrease Outcome: Adequate for Discharge   Problem: Elimination: Goal: Will not experience complications related to bowel motility Outcome: Adequate for Discharge Goal: Will not experience complications related to urinary retention Outcome: Adequate for Discharge   Problem: Pain Managment: Goal: General experience of comfort will improve Outcome: Adequate for Discharge   Problem: Safety: Goal: Ability to remain free from injury will improve Outcome: Adequate for Discharge   Problem: Skin Integrity: Goal: Risk for impaired skin integrity will decrease Outcome: Adequate for Discharge   Problem: Education: Goal: Knowledge of General Education information will improve Description: Including pain rating scale, medication(s)/side effects and non-pharmacologic comfort measures Outcome: Adequate for Discharge   Problem: Health  Behavior/Discharge Planning: Goal: Ability to manage health-related needs will improve Outcome: Adequate for Discharge   Problem: Clinical Measurements: Goal: Ability to maintain clinical measurements within normal limits will improve Outcome: Adequate for Discharge Goal: Will remain free from infection Outcome: Adequate for Discharge Goal: Diagnostic test results will improve Outcome: Adequate for Discharge Goal: Respiratory complications will improve Outcome: Adequate for Discharge Goal: Cardiovascular complication will be avoided Outcome: Adequate for Discharge   Problem: Activity: Goal: Risk for activity intolerance will decrease Outcome: Adequate for Discharge   Problem: Coping: Goal: Level of anxiety will decrease Outcome: Adequate for Discharge   Problem: Elimination: Goal: Will not experience complications related to bowel motility Outcome: Adequate for Discharge Goal: Will not experience complications related to urinary retention Outcome: Adequate for Discharge   Problem: Pain Managment: Goal: General experience of comfort will improve Outcome: Adequate for Discharge   Problem: Safety: Goal: Ability to remain free from injury will improve Outcome: Adequate for Discharge   Problem: Skin Integrity: Goal: Risk for impaired skin integrity will decrease Outcome: Adequate for Discharge

## 2021-01-05 NOTE — Progress Notes (Signed)
Pt off floor for discharge via wheelchair accompanied by this RN, wearing his own clothes, has all personal belongings.

## 2021-01-05 NOTE — Discharge Summary (Signed)
Nathaniel Nguyen    MR#:  [id number]  DATE OF BIRTH:  Jun 05, 1946  DATE OF ADMISSION:  12/28/2020 ADMITTING PHYSICIAN: Collier Bullock, MD  DATE OF DISCHARGE: 01/05/2021 10:30 AM  PRIMARY CARE PHYSICIAN: Tracie Harrier, MD    ADMISSION DIAGNOSIS:  Generalized weakness [R53.1] AKI (acute kidney injury) ([place]) [N17.9]  DISCHARGE DIAGNOSIS:  Principal Problem:   AKI (acute kidney injury) ([place]) Active Problems:   CKD stage 3 due to type 2 diabetes mellitus ([place])   Type 2 diabetes mellitus with hypoglycemia without coma ([place])   Coronary artery disease   Hypertension   Transaminitis   SECONDARY DIAGNOSIS:   Past Medical History:  Diagnosis Date   Cardiomyopathy ([place])    Chronic kidney disease    chronic, stage III   Coronary artery disease    Diabetes mellitus without complication ([place])    Dysrhythmia    hx V-Tach   Glaucoma    Hypercholesterolemia    Hypertension    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  BUN 83 and creatinine 6.43 at its peak.  BUN came down to 32 and creatinine 3.64 upon discharge with a GFR of 17.  Patient was seen by nephrology during the hospital course.  Patient does have an outpatient nephrologist that he will follow-up with.  Renal ultrasound showing echogenicity which goes along with medical renal disease.  Small cyst upper lobe left kidney. Elevated liver enzymes.  Renal ultrasound showed gallbladder wall thickening.  Patient not having any abdominal pain currently.  HIDA scan negative for acute cholecystitis.  Hold Lipitor.  AST peaked at 189 and ALT peaked at 144.  Upon discharge AST 120 and ALT 131. Type 2 diabetes mellitus with hyperlipidemia.  Hold Lipitor with elevated liver enzymes.  Hold glimepiride for right now.  Hemoglobin A1c is 7.2.  Diet control at this point. History of CAD on  Plavix aspirin and metoprolol Essential hypertension on metoprolol Anemia of chronic disease.  Last hemoglobin 8.7 Initially came in with slurred speech and unsteady gait.  MRI of the brain were negative for acute events.  Did well with physical therapy and recommended no follow-up.  Symptoms likely secondary to uremia with elevated BUN and creatinine upon presentation.  DISCHARGE CONDITIONS:   Satisfactory  CONSULTS OBTAINED:    Nephrology  DRUG ALLERGIES:   Allergies  Allergen Reactions   Metformin And Related     DISCHARGE MEDICATIONS:   Allergies as of 01/05/2021       Reactions   Metformin And Related         Medication List     STOP taking these medications    amLODipine 5 MG tablet Commonly known as: NORVASC   atorvastatin 40 MG tablet Commonly known as: LIPITOR   glimepiride 1 MG tablet Commonly known as: AMARYL       TAKE these medications    allopurinol 100 MG tablet Commonly known as: ZYLOPRIM Take 100 mg daily by mouth.   aspirin EC 81 MG tablet Take 81 mg daily by mouth.   Calcium 600+D 600-800 MG-UNIT Tabs Generic drug: Calcium Carb-Cholecalciferol Take 1 tablet by mouth daily.   clopidogrel 75 MG tablet Commonly known as: PLAVIX Take 75 mg daily by mouth.   IRON-VITAMIN C PO Take 1 tablet daily by mouth.   metoprolol succinate 100 MG 24  hr tablet Commonly known as: TOPROL-XL Take 50 mg by mouth 2 (two) times daily. Take with or immediately following a meal.   nystatin 100000 UNIT/ML suspension Commonly known as: MYCOSTATIN Take 5 mLs (500,000 Units total) by mouth 4 (four) times daily for 10 days.   sildenafil 20 MG tablet Commonly known as: REVATIO Take 60 mg as needed by mouth.   timolol 0.25 % ophthalmic solution Commonly known as: TIMOPTIC Place 1 drop daily into the left eye.         DISCHARGE INSTRUCTIONS:   Follow-up PMD 5 days Follow-up with your nephrologist   If you experience worsening of your  admission symptoms, develop shortness of breath, life threatening emergency, suicidal or homicidal thoughts you must seek medical attention immediately by calling 911 or calling your MD immediately  if symptoms less severe.  You Must read complete instructions/literature along with all the possible adverse reactions/side effects for all the Medicines you take and that have been prescribed to you. Take any new Medicines after you have completely understood and accept all the possible adverse reactions/side effects.   Please note  You were cared for by a hospitalist during your hospital stay. If you have any questions about your discharge medications or the care you received while you were in the hospital after you are discharged, you can call the unit and asked to speak with the hospitalist on call if the hospitalist that took care of you is not available. Once you are discharged, your primary care physician will handle any further medical issues. Please note that NO REFILLS for any discharge medications will be authorized once you are discharged, as it is imperative that you return to your primary care physician (or establish a relationship with a primary care physician if you do not have one) for your aftercare needs so that they can reassess your need for medications and monitor your lab values.    Today   CHIEF COMPLAINT:   Chief Complaint  Patient presents with   Aphasia    HISTORY OF PRESENT ILLNESS:  Nathaniel Nguyen  is a 75 y.o. male came in initially with unsteady gait and difficulty talking and found to have elevated BUN and creatinine   VITAL SIGNS:  Blood pressure (!) 147/79, pulse 85, temperature 99.3 F (37.4 C), temperature source Oral, resp. rate 16, height 5\' 8"  (1.727 m), weight 77.1 kg, SpO2 97 %.  I/O:   Intake/Output Summary (Last 24 hours) at 01/05/2021 1555 Last data filed at 01/05/2021 0300 Gross per 24 hour  Intake 965 ml  Output 650 ml  Net 315 ml     PHYSICAL EXAMINATION:  GENERAL:  75 y.o.-year-old patient lying in the bed with no acute distress.  EYES: Pupils equal, round, reactive to light and accommodation. No scleral icterus. Extraocular muscles intact.  HEENT: Head atraumatic, normocephalic. Oropharynx and nasopharynx clear.  LUNGS: Normal breath sounds bilaterally, no wheezing, rales,rhonchi or crepitation. No use of accessory muscles of respiration.  CARDIOVASCULAR: S1, S2 normal. No murmurs, rubs, or gallops.  ABDOMEN: Soft, non-tender, non-distended. Bowel sounds present. No organomegaly or mass.  EXTREMITIES: No pedal edema  NEUROLOGIC: Cranial nerves II through XII are intact.  PSYCHIATRIC: The patient is alert and oriented x 3.  SKIN: No obvious rash, lesion, or ulcer.   DATA REVIEW:   CBC Recent Labs  Lab 01/05/21 0552  WBC 7.1  HGB 8.7*  HCT 26.5*  PLT 228    Chemistries  Recent Labs  Lab 01/05/21 0552  NA 142  K 3.6  CL 103  CO2 29  GLUCOSE 143*  BUN 32*  CREATININE 3.64*  CALCIUM 7.9*  AST 120*  ALT 131*  ALKPHOS 90  BILITOT 1.1     Microbiology Results  Results for orders placed or performed during the hospital encounter of 12/28/20  Resp Panel by RT-PCR (Flu A&B, Covid) Nasopharyngeal Swab     Status: None   Collection Time: 12/28/20  2:40 PM   Specimen: Nasopharyngeal Swab; Nasopharyngeal(NP) swabs in vial transport medium  Result Value Ref Range Status   SARS Coronavirus 2 by RT PCR NEGATIVE NEGATIVE Final    Comment: (NOTE) SARS-CoV-2 target nucleic acids are NOT DETECTED.  The SARS-CoV-2 RNA is generally detectable in upper respiratory specimens during the acute phase of infection. The lowest concentration of SARS-CoV-2 viral copies this assay can detect is 138 copies/mL. A negative result does not preclude SARS-Cov-2 infection and should not be used as the sole basis for treatment or other patient management decisions. A negative result may occur with  improper specimen  collection/handling, submission of specimen other than nasopharyngeal swab, presence of viral mutation(s) within the areas targeted by this assay, and inadequate number of viral copies(<138 copies/mL). A negative result must be combined with clinical observations, patient history, and epidemiological information. The expected result is Negative.  Fact Sheet for Patients:  EntrepreneurPulse.com.au  Fact Sheet for Healthcare Providers:  IncredibleEmployment.be  This test is no t yet approved or cleared by the [place] FDA and  has been authorized for detection and/or diagnosis of SARS-CoV-2 by FDA under an Emergency Use Authorization (EUA). This EUA will remain  in effect (meaning this test can be used) for the duration of the COVID-19 declaration under Section 564(b)(1) of the Act, 21 U.S.C.section 360bbb-3(b)(1), unless the authorization is terminated  or revoked sooner.       Influenza A by PCR NEGATIVE NEGATIVE Final   Influenza B by PCR NEGATIVE NEGATIVE Final    Comment: (NOTE) The Xpert Xpress SARS-CoV-2/FLU/RSV plus assay is intended as an aid in the diagnosis of influenza from Nasopharyngeal swab specimens and should not be used as a sole basis for treatment. Nasal washings and aspirates are unacceptable for Xpert Xpress SARS-CoV-2/FLU/RSV testing.  Fact Sheet for Patients: EntrepreneurPulse.com.au  Fact Sheet for Healthcare Providers: IncredibleEmployment.be  This test is not yet approved or cleared by the [place] FDA and has been authorized for detection and/or diagnosis of SARS-CoV-2 by FDA under an Emergency Use Authorization (EUA). This EUA will remain in effect (meaning this test can be used) for the duration of the COVID-19 declaration under Section 564(b)(1) of the Act, 21 U.S.C. section 360bbb-3(b)(1), unless the authorization is terminated or revoked.  Performed at [facility], [address] [place], [place] [address]     RADIOLOGY:  NM Hepatobiliary Liver Func  Result Date: 01/04/2021 CLINICAL DATA:  Gallbladder wall thickening on ultrasound. EXAM: NUCLEAR MEDICINE HEPATOBILIARY IMAGING TECHNIQUE: Sequential images of the abdomen were obtained out to 90 minutes following intravenous administration of radiopharmaceutical. RADIOPHARMACEUTICALS:  5.6 mCi Tc-79m  Choletec IV COMPARISON:  Ultrasound 12/28/2020 FINDINGS: Prompt clearance radiotracer from blood pool and homogeneous uptake in liver. Counts are evident in the small bowel by 15 minutes. Gallbladder fails to fill at 90 minutes. 3 mg of IV morphine were administered to augment filling of the gallbladder. Post morphine injection the gallbladder fills. Ejection fraction cannot be calculated once morphine has been administered. IMPRESSION: 1. Patent cystic duct and common  bile duct is inconsistent with acute cholecystitis. 2. Gallbladder ejection fraction could not be calculated as above. Electronically Signed   By: Suzy Bouchard M.D.   On: 01/04/2021 15:05      Management plans discussed with the patient, family and they are in agreement.  CODE STATUS:  Code Status History     Date Active Date Inactive Code Status Order ID Comments User Context   12/28/2020 1504 01/05/2021 1531 Full Code [id number]  Collier Bullock, MD ED      Questions for Most Recent Historical Code Status (Order [id number])        TOTAL TIME TAKING CARE OF THIS PATIENT: 35 minutes.    Loletha Grayer M.D on 01/05/2021 at 3:55 PM   [facility]  CC: Primary care physician; Tracie Harrier, MD

## 2022-02-08 ENCOUNTER — Other Ambulatory Visit: Payer: Self-pay

## 2022-02-08 ENCOUNTER — Emergency Department: Payer: Medicare PPO

## 2022-02-08 ENCOUNTER — Inpatient Hospital Stay
Admission: EM | Admit: 2022-02-08 | Discharge: 2022-02-11 | DRG: 872 | Disposition: A | Discharge status: Home / Self Care | Payer: Medicare PPO | Attending: Internal Medicine | Admitting: Internal Medicine

## 2022-02-08 ENCOUNTER — Observation Stay: Payer: Medicare PPO

## 2022-02-08 DIAGNOSIS — N138 Other obstructive and reflux uropathy: Secondary | ICD-10-CM | POA: Diagnosis present

## 2022-02-08 DIAGNOSIS — I2510 Atherosclerotic heart disease of native coronary artery without angina pectoris: Secondary | ICD-10-CM | POA: Diagnosis present

## 2022-02-08 DIAGNOSIS — Z79899 Other long term (current) drug therapy: Secondary | ICD-10-CM

## 2022-02-08 DIAGNOSIS — D638 Anemia in other chronic diseases classified elsewhere: Secondary | ICD-10-CM | POA: Diagnosis present

## 2022-02-08 DIAGNOSIS — I429 Cardiomyopathy, unspecified: Secondary | ICD-10-CM | POA: Diagnosis present

## 2022-02-08 DIAGNOSIS — N401 Enlarged prostate with lower urinary tract symptoms: Secondary | ICD-10-CM | POA: Diagnosis present

## 2022-02-08 DIAGNOSIS — N179 Acute kidney failure, unspecified: Secondary | ICD-10-CM | POA: Diagnosis present

## 2022-02-08 DIAGNOSIS — N390 Urinary tract infection, site not specified: Secondary | ICD-10-CM | POA: Diagnosis present

## 2022-02-08 DIAGNOSIS — Z7982 Long term (current) use of aspirin: Secondary | ICD-10-CM

## 2022-02-08 DIAGNOSIS — E7800 Pure hypercholesterolemia, unspecified: Secondary | ICD-10-CM | POA: Diagnosis present

## 2022-02-08 DIAGNOSIS — E1122 Type 2 diabetes mellitus with diabetic chronic kidney disease: Secondary | ICD-10-CM | POA: Diagnosis present

## 2022-02-08 DIAGNOSIS — E1169 Type 2 diabetes mellitus with other specified complication: Secondary | ICD-10-CM | POA: Diagnosis present

## 2022-02-08 DIAGNOSIS — I129 Hypertensive chronic kidney disease with stage 1 through stage 4 chronic kidney disease, or unspecified chronic kidney disease: Secondary | ICD-10-CM | POA: Diagnosis present

## 2022-02-08 DIAGNOSIS — Z20822 Contact with and (suspected) exposure to covid-19: Secondary | ICD-10-CM | POA: Diagnosis present

## 2022-02-08 DIAGNOSIS — N136 Pyonephrosis: Secondary | ICD-10-CM | POA: Diagnosis present

## 2022-02-08 DIAGNOSIS — I10 Essential (primary) hypertension: Secondary | ICD-10-CM | POA: Diagnosis present

## 2022-02-08 DIAGNOSIS — H409 Unspecified glaucoma: Secondary | ICD-10-CM | POA: Diagnosis present

## 2022-02-08 DIAGNOSIS — R197 Diarrhea, unspecified: Secondary | ICD-10-CM | POA: Diagnosis present

## 2022-02-08 DIAGNOSIS — N184 Chronic kidney disease, stage 4 (severe): Secondary | ICD-10-CM | POA: Diagnosis present

## 2022-02-08 DIAGNOSIS — A4151 Sepsis due to Escherichia coli [E. coli]: Secondary | ICD-10-CM | POA: Diagnosis not present

## 2022-02-08 DIAGNOSIS — I252 Old myocardial infarction: Secondary | ICD-10-CM

## 2022-02-08 DIAGNOSIS — A419 Sepsis, unspecified organism: Secondary | ICD-10-CM | POA: Diagnosis not present

## 2022-02-08 DIAGNOSIS — R319 Hematuria, unspecified: Secondary | ICD-10-CM | POA: Diagnosis present

## 2022-02-08 DIAGNOSIS — D631 Anemia in chronic kidney disease: Secondary | ICD-10-CM | POA: Diagnosis present

## 2022-02-08 DIAGNOSIS — Z87891 Personal history of nicotine dependence: Secondary | ICD-10-CM

## 2022-02-08 DIAGNOSIS — R6520 Severe sepsis without septic shock: Secondary | ICD-10-CM | POA: Diagnosis not present

## 2022-02-08 DIAGNOSIS — M109 Gout, unspecified: Secondary | ICD-10-CM | POA: Diagnosis present

## 2022-02-08 DIAGNOSIS — C642 Malignant neoplasm of left kidney, except renal pelvis: Secondary | ICD-10-CM | POA: Diagnosis present

## 2022-02-08 DIAGNOSIS — Z833 Family history of diabetes mellitus: Secondary | ICD-10-CM

## 2022-02-08 DIAGNOSIS — I700 Atherosclerosis of aorta: Secondary | ICD-10-CM | POA: Diagnosis present

## 2022-02-08 DIAGNOSIS — Z888 Allergy status to other drugs, medicaments and biological substances status: Secondary | ICD-10-CM

## 2022-02-08 HISTORY — DX: Sepsis, unspecified organism: R65.20

## 2022-02-08 LAB — COMPREHENSIVE METABOLIC PANEL
ALT: 19 U/L (ref 0–44)
AST: 31 U/L (ref 15–41)
Albumin: 3 g/dL — ABNORMAL LOW (ref 3.5–5.0)
Alkaline Phosphatase: 67 U/L (ref 38–126)
Anion gap: 9 (ref 5–15)
BUN: 42 mg/dL — ABNORMAL HIGH (ref 8–23)
CO2: 21 mmol/L — ABNORMAL LOW (ref 22–32)
Calcium: 7.8 mg/dL — ABNORMAL LOW (ref 8.9–10.3)
Chloride: 110 mmol/L (ref 98–111)
Creatinine, Ser: 3.11 mg/dL — ABNORMAL HIGH (ref 0.61–1.24)
GFR, Estimated: 20 mL/min — ABNORMAL LOW (ref >60)
Glucose, Bld: 146 mg/dL — ABNORMAL HIGH (ref 70–99)
Potassium: 4.5 mmol/L (ref 3.5–5.1)
Sodium: 140 mmol/L (ref 135–145)
Total Bilirubin: 1.2 mg/dL (ref 0.3–1.2)
Total Protein: 5.9 g/dL — ABNORMAL LOW (ref 6.5–8.1)

## 2022-02-08 LAB — RESP PANEL BY RT-PCR (FLU A&B, COVID) ARPGX2
Influenza A by PCR: NEGATIVE
Influenza B by PCR: NEGATIVE
SARS Coronavirus 2 by RT PCR: NEGATIVE

## 2022-02-08 LAB — URINALYSIS, COMPLETE (UACMP) WITH MICROSCOPIC
Bilirubin Urine: NEGATIVE
Glucose, UA: NEGATIVE mg/dL
Ketones, ur: NEGATIVE mg/dL
Nitrite: NEGATIVE
Protein, ur: 100 mg/dL — AB
RBC / HPF: >50 RBC/hpf — ABNORMAL HIGH (ref 0–5)
Specific Gravity, Urine: 1.016 (ref 1.005–1.030)
WBC, UA: >50 WBC/hpf — ABNORMAL HIGH (ref 0–5)
pH: 5 (ref 5.0–8.0)

## 2022-02-08 LAB — CBG MONITORING, ED: Glucose-Capillary: 79 mg/dL (ref 70–99)

## 2022-02-08 LAB — CBC
HCT: 31.3 % — ABNORMAL LOW (ref 39.0–52.0)
Hemoglobin: 9.8 g/dL — ABNORMAL LOW (ref 13.0–17.0)
MCH: 26.2 pg (ref 26.0–34.0)
MCHC: 31.3 g/dL (ref 30.0–36.0)
MCV: 83.7 fL (ref 80.0–100.0)
Platelets: 127 10*3/uL — ABNORMAL LOW (ref 150–400)
RBC: 3.74 MIL/uL — ABNORMAL LOW (ref 4.22–5.81)
RDW: 15.1 % (ref 11.5–15.5)
WBC: 22.9 10*3/uL — ABNORMAL HIGH (ref 4.0–10.5)
nRBC: 0 % (ref 0.0–0.2)

## 2022-02-08 LAB — APTT: aPTT: 34 seconds (ref 24–36)

## 2022-02-08 LAB — PROCALCITONIN: Procalcitonin: 130.69 ng/mL

## 2022-02-08 LAB — BASIC METABOLIC PANEL
Anion gap: 6 (ref 5–15)
BUN: 43 mg/dL — ABNORMAL HIGH (ref 8–23)
CO2: 20 mmol/L — ABNORMAL LOW (ref 22–32)
Calcium: 8.3 mg/dL — ABNORMAL LOW (ref 8.9–10.3)
Chloride: 111 mmol/L (ref 98–111)
Creatinine, Ser: 3.73 mg/dL — ABNORMAL HIGH (ref 0.61–1.24)
GFR, Estimated: 16 mL/min — ABNORMAL LOW (ref >60)
Glucose, Bld: 67 mg/dL — ABNORMAL LOW (ref 70–99)
Potassium: 5.5 mmol/L — ABNORMAL HIGH (ref 3.5–5.1)
Sodium: 137 mmol/L (ref 135–145)

## 2022-02-08 LAB — PROTIME-INR
INR: 1.4 — ABNORMAL HIGH (ref 0.8–1.2)
Prothrombin Time: 16.9 seconds — ABNORMAL HIGH (ref 11.4–15.2)

## 2022-02-08 LAB — LACTIC ACID, PLASMA
Lactic Acid, Venous: 2.6 mmol/L (ref 0.5–1.9)
Lactic Acid, Venous: 3 mmol/L (ref 0.5–1.9)

## 2022-02-08 LAB — GLUCOSE, CAPILLARY: Glucose-Capillary: 186 mg/dL — ABNORMAL HIGH (ref 70–99)

## 2022-02-08 MED ORDER — SODIUM CHLORIDE 0.9 % IV SOLN
2.0000 g | Freq: Once | INTRAVENOUS | Status: AC
  Administered 2022-02-08: 2 g via INTRAVENOUS
  Filled 2022-02-08: qty 20

## 2022-02-08 MED ORDER — ACETAMINOPHEN 650 MG RE SUPP: 650.0000 mg | Freq: Four times a day (QID) | RECTAL | Status: DC | PRN

## 2022-02-08 MED ORDER — VANCOMYCIN HCL IN DEXTROSE 1-5 GM/200ML-% IV SOLN: 1000.0000 mg | INTRAVENOUS | Status: DC

## 2022-02-08 MED ORDER — ATORVASTATIN CALCIUM 20 MG PO TABS
40.0000 mg | ORAL_TABLET | Freq: Every day | ORAL | Status: DC
  Administered 2022-02-09 – 2022-02-11 (×3): 40 mg via ORAL
  Filled 2022-02-08 (×3): qty 2

## 2022-02-08 MED ORDER — LACTATED RINGERS IV SOLN: INTRAVENOUS | Status: DC

## 2022-02-08 MED ORDER — INSULIN ASPART 100 UNIT/ML IJ SOLN: 0.0000 [IU] | Freq: Every day | INTRAMUSCULAR | Status: DC

## 2022-02-08 MED ORDER — AMLODIPINE BESYLATE 5 MG PO TABS: 5.0000 mg | ORAL_TABLET | Freq: Every day | ORAL | Status: DC

## 2022-02-08 MED ORDER — METRONIDAZOLE 500 MG/100ML IV SOLN
500.0000 mg | Freq: Once | INTRAVENOUS | Status: AC
Start: 2022-02-08 — End: 2022-02-08
  Administered 2022-02-08: 500 mg via INTRAVENOUS
  Filled 2022-02-08: qty 100

## 2022-02-08 MED ORDER — LACTATED RINGERS IV BOLUS (SEPSIS)
1000.0000 mL | Freq: Once | INTRAVENOUS | Status: AC
  Administered 2022-02-08: 1000 mL via INTRAVENOUS

## 2022-02-08 MED ORDER — ONDANSETRON HCL 4 MG/2ML IJ SOLN: 4.0000 mg | Freq: Four times a day (QID) | INTRAMUSCULAR | Status: DC | PRN

## 2022-02-08 MED ORDER — METRONIDAZOLE 500 MG/100ML IV SOLN: 500.0000 mg | Freq: Two times a day (BID) | INTRAVENOUS | Status: DC

## 2022-02-08 MED ORDER — METOPROLOL SUCCINATE ER 50 MG PO TB24: 50.0000 mg | ORAL_TABLET | Freq: Two times a day (BID) | ORAL | Status: DC

## 2022-02-08 MED ORDER — HEPARIN SODIUM (PORCINE) 5000 UNIT/ML IJ SOLN
5000.0000 [IU] | Freq: Three times a day (TID) | INTRAMUSCULAR | Status: DC
  Administered 2022-02-08 – 2022-02-11 (×8): 5000 [IU] via SUBCUTANEOUS
  Filled 2022-02-08 (×8): qty 1

## 2022-02-08 MED ORDER — ASPIRIN 81 MG PO CHEW
81.0000 mg | CHEWABLE_TABLET | Freq: Every day | ORAL | Status: DC
  Administered 2022-02-09 – 2022-02-11 (×3): 81 mg via ORAL
  Filled 2022-02-08 (×3): qty 1

## 2022-02-08 MED ORDER — VANCOMYCIN HCL 1500 MG/300ML IV SOLN
1500.0000 mg | Freq: Once | INTRAVENOUS | Status: AC
  Administered 2022-02-08: 1500 mg via INTRAVENOUS
  Filled 2022-02-08: qty 300

## 2022-02-08 MED ORDER — ONDANSETRON HCL 4 MG PO TABS: 4.0000 mg | ORAL_TABLET | Freq: Four times a day (QID) | ORAL | Status: DC | PRN

## 2022-02-08 MED ORDER — METRONIDAZOLE 500 MG/100ML IV SOLN
500.0000 mg | Freq: Two times a day (BID) | INTRAVENOUS | Status: DC
  Administered 2022-02-09: 500 mg via INTRAVENOUS
  Filled 2022-02-08 (×2): qty 100

## 2022-02-08 MED ORDER — INSULIN ASPART 100 UNIT/ML IJ SOLN
0.0000 [IU] | Freq: Three times a day (TID) | INTRAMUSCULAR | Status: DC
  Administered 2022-02-09 (×2): 1 [IU] via SUBCUTANEOUS
  Administered 2022-02-10: 2 [IU] via SUBCUTANEOUS
  Administered 2022-02-10: 1 [IU] via SUBCUTANEOUS
  Filled 2022-02-08 (×3): qty 1

## 2022-02-08 MED ORDER — ACETAMINOPHEN 325 MG PO TABS: 650.0000 mg | ORAL_TABLET | Freq: Four times a day (QID) | ORAL | Status: DC | PRN

## 2022-02-08 MED ORDER — SODIUM CHLORIDE 0.9 % IV BOLUS
1000.0000 mL | Freq: Once | INTRAVENOUS | Status: AC
Start: 2022-02-08 — End: 2022-02-08
  Administered 2022-02-08: 1000 mL via INTRAVENOUS

## 2022-02-08 MED ORDER — TIMOLOL MALEATE 0.25 % OP SOLN
1.0000 [drp] | Freq: Every morning | OPHTHALMIC | Status: DC
  Administered 2022-02-09 – 2022-02-11 (×3): 1 [drp] via OPHTHALMIC
  Filled 2022-02-08: qty 5

## 2022-02-08 MED ORDER — LACTATED RINGERS IV BOLUS (SEPSIS)
500.0000 mL | Freq: Once | INTRAVENOUS | Status: AC
  Administered 2022-02-08: 500 mL via INTRAVENOUS

## 2022-02-08 MED ORDER — SODIUM CHLORIDE 0.9 % IV SOLN
2.0000 g | INTRAVENOUS | Status: DC
  Administered 2022-02-08: 2 g via INTRAVENOUS
  Filled 2022-02-08 (×2): qty 12.5

## 2022-02-08 NOTE — ED Triage Notes (Signed)
First Nurse Note:  Arrives with family. C/O diarrhea, fever, and chills since last night.  Patient AAOx3.  Skin warm and dry. NAD

## 2022-02-08 NOTE — ED Triage Notes (Signed)
Patient to ER via POV with complaints of weakness, diarrhea, chills, possible fevers last night, and hematuria. Reports symptoms started last night. Denies known sick contacts.

## 2022-02-08 NOTE — Assessment & Plan Note (Addendum)
-   Atorvastatin 40 mg daily resumed - Insulin SSI with at bedtime coverage ordered - I have not resumed home, p.o. antihyperglycemic agents, glipizide - Goal inpatient blood glucose levels 140-180

## 2022-02-08 NOTE — ED Provider Notes (Signed)
[facility] Provider Note   Event Date/Time   First MD Initiated Contact with Patient 02/08/22 1316     (approximate) History  Diarrhea and Weakness  HPI Nathaniel Nguyen is a 77 y.o. male who presents for generalized weakness and dysuria over the past 2 days.  Patient also endorses subjective fevers, chills, diarrhea, and hematuria that began last night.  Patient denies any known sick contacts ROS: Patient currently denies any vision changes, tinnitus, difficulty speaking, facial droop, sore throat, chest pain, shortness of breath, abdominal pain, nausea/vomiting, dysuria, or weakness/numbness/paresthesias in any extremity   Physical Exam  Triage Vital Signs: ED Triage Vitals  Enc Vitals Group     BP 02/08/22 1308 (!) 99/56     Pulse Rate 02/08/22 1308 90     Resp 02/08/22 1308 18     Temp 02/08/22 1310 98.2 F (36.8 C)     Temp Source 02/08/22 1310 Oral     SpO2 02/08/22 1308 98 %     Weight 02/08/22 1533 168 lb (76.2 kg)     Height 02/08/22 1309 '5\' 8"'$  (1.727 m)     Head Circumference --      Peak Flow --      Pain Score 02/08/22 1309 0     Pain Loc --      Pain Edu? --      Excl. in [place]? --    Most recent vital signs: Vitals:   02/08/22 1310 02/08/22 1450  BP:  (!) 114/55  Pulse:  81  Resp:  18  Temp: 98.2 F (36.8 C)   SpO2:  99%   General: Awake, oriented x4. CV:  Good peripheral perfusion.  Resp:  Normal effort.  Abd:  No distention.  No CVA tenderness to percussion Other:  Elderly African-American male laying in bed in no acute distress. ED Results / Procedures / Treatments  Labs (all labs ordered are listed, but only abnormal results are displayed) Labs Reviewed  BASIC METABOLIC PANEL - Abnormal; Notable for the following components:      Result Value   Potassium 5.5 (*)    CO2 20 (*)    Glucose, Bld 67 (*)    BUN 43 (*)    Creatinine, Ser 3.73 (*)    Calcium 8.3 (*)    GFR, Estimated 16 (*)    All other components within  normal limits  CBC - Abnormal; Notable for the following components:   WBC 22.9 (*)    RBC 3.74 (*)    Hemoglobin 9.8 (*)    HCT 31.3 (*)    Platelets 127 (*)    All other components within normal limits  LACTIC ACID, PLASMA - Abnormal; Notable for the following components:   Lactic Acid, Venous 2.6 (*)    All other components within normal limits  PROTIME-INR - Abnormal; Notable for the following components:   Prothrombin Time 16.9 (*)    INR 1.4 (*)    All other components within normal limits  URINALYSIS, COMPLETE (UACMP) WITH MICROSCOPIC - Abnormal; Notable for the following components:   Color, Urine YELLOW (*)    APPearance TURBID (*)    Hgb urine dipstick LARGE (*)    Protein, ur 100 (*)    Leukocytes,Ua MODERATE (*)    RBC / HPF >50 (*)    WBC, UA >50 (*)    Bacteria, UA MANY (*)    All other components within normal limits  RESP PANEL BY RT-PCR (FLU A&B,  COVID) [id number]  CULTURE, BLOOD (ROUTINE X 2)  CULTURE, BLOOD (ROUTINE X 2)  URINE CULTURE  C DIFFICILE QUICK SCREEN W PCR REFLEX    GASTROINTESTINAL PANEL BY PCR, STOOL (REPLACES STOOL CULTURE)  APTT  LACTIC ACID, PLASMA  COMPREHENSIVE METABOLIC PANEL  PROCALCITONIN  CBG MONITORING, ED   EKG ED ECG REPORT I, Naaman Plummer, the attending physician, personally viewed and interpreted this ECG. Date: 02/08/2022 EKG Time: 1312 Rate: 90 Rhythm: normal sinus rhythm QRS Axis: normal Intervals: normal ST/T Wave abnormalities: normal Narrative Interpretation: no evidence of acute ischemia RADIOLOGY ED MD interpretation: View portable chest x-ray interpreted by me shows no evidence of acute abnormalities including no pneumonia, pneumothorax, or widened mediastinum -Agree with radiology assessment Official radiology report(s): DG Chest Port 1 View  Result Date: 02/08/2022 CLINICAL DATA:  Fever and chills. EXAM: PORTABLE CHEST 1 VIEW COMPARISON:  12/29/2020 and prior radiographs FINDINGS: The cardiomediastinal  silhouette is unremarkable. There is no evidence of focal airspace disease, pulmonary edema, suspicious pulmonary nodule/mass, pleural effusion, or pneumothorax. No acute bony abnormalities are identified. IMPRESSION: No active disease. Electronically Signed   By: Margarette Canada M.D.   On: 02/08/2022 14:53   PROCEDURES: Critical Care performed: Yes, see critical care procedure note(s) .1-3 Lead EKG Interpretation  Performed by: Naaman Plummer, MD Authorized by: Naaman Plummer, MD     Interpretation: normal     ECG rate:  82   ECG rate assessment: normal     Rhythm: sinus rhythm     Ectopy: none     Conduction: normal   CRITICAL CARE Performed by: Naaman Plummer  Total critical care time: 31 minutes  Critical care time was exclusive of separately billable procedures and treating other patients.  Critical care was necessary to treat or prevent imminent or life-threatening deterioration.  Critical care was time spent personally by me on the following activities: development of treatment plan with patient and/or surrogate as well as nursing, discussions with consultants, evaluation of patient's response to treatment, examination of patient, obtaining history from patient or surrogate, ordering and performing treatments and interventions, ordering and review of laboratory studies, ordering and review of radiographic studies, pulse oximetry and re-evaluation of patient's condition.  MEDICATIONS ORDERED IN ED: Medications  lactated ringers infusion (has no administration in time range)  metroNIDAZOLE (FLAGYL) IVPB 500 mg (500 mg Intravenous New Bag/Given 02/08/22 1541)  lactated ringers bolus 1,000 mL (0 mLs Intravenous Stopped 02/08/22 1524)    And  lactated ringers bolus 1,000 mL (1,000 mLs Intravenous New Bag/Given 02/08/22 1532)    And  lactated ringers bolus 500 mL (has no administration in time range)  acetaminophen (TYLENOL) tablet 650 mg (has no administration in time range)    Or   acetaminophen (TYLENOL) suppository 650 mg (has no administration in time range)  ondansetron (ZOFRAN) tablet 4 mg (has no administration in time range)    Or  ondansetron (ZOFRAN) injection 4 mg (has no administration in time range)  heparin injection 5,000 Units (has no administration in time range)  metroNIDAZOLE (FLAGYL) IVPB 500 mg (has no administration in time range)  vancomycin (VANCOREADY) IVPB 1500 mg/300 mL (has no administration in time range)  vancomycin (VANCOCIN) IVPB 1000 mg/200 mL premix (has no administration in time range)  ceFEPIme (MAXIPIME) 2 g in sodium chloride 0.9 % 100 mL IVPB (has no administration in time range)  sodium chloride 0.9 % bolus 1,000 mL (0 mLs Intravenous Stopped 02/08/22 1447)  cefTRIAXone (ROCEPHIN) 2  g in sodium chloride 0.9 % 100 mL IVPB (0 g Intravenous Stopped 02/08/22 1515)   IMPRESSION / MDM / ASSESSMENT AND PLAN / ED COURSE  I reviewed the triage vital signs and the nursing notes.                             The patient is on the cardiac monitor to evaluate for evidence of arrhythmia and/or significant heart rate changes. Patient's presentation is most consistent with acute presentation with potential threat to life or bodily function. The Pt presents with dysuria, diarrhea, and fever highly concerning for sepsis (suspected urinary source). At this time, the Pt is satting well on room air and appears HDS.  Will start empiric antibiotics and fluids.  Due to mild hypotension, will administer fluids gradually with frequent reassessment. Have low suspicion for a GI, skin/soft tissue, or CNS source at this time, but will reconsider if initial workup is unremarkable.  - CBC, BMP, LFTs - VBG - UA - BCx x2, Lactate - EKG - CXR - CT abdomen and pelvis without contrast, pending - Empiric Abx: Rocephin, Flagyl - Fluids: 30cc/kg LR  UA showing likely urinary tract infection Leukocytosis to 22,000  Dispo: Admit to medicine   FINAL CLINICAL  IMPRESSION(S) / ED DIAGNOSES   Final diagnoses:  Sepsis without acute organ dysfunction, due to unspecified organism [facility])  Urinary tract infection with hematuria, site unspecified   Rx / DC Orders   ED Discharge Orders     None      Note:  This document was prepared using Dragon voice recognition software and may include unintentional dictation errors.   Naaman Plummer, MD 02/08/22 [phone]

## 2022-02-08 NOTE — ED Notes (Signed)
Lactic 2.6 notified Dr. Cheri Fowler and acknowledged.

## 2022-02-08 NOTE — Progress Notes (Signed)
CODE SEPSIS - PHARMACY COMMUNICATION  **Broad Spectrum Antibiotics should be administered within 1 hour of Sepsis diagnosis**  Time Code Sepsis Called/Page Received: 1400  Antibiotics Ordered: ceftriaxone 2 grams x 1, metronidazole 500 mg x 1  Time of 1st antibiotic administration: 1443  Additional action taken by pharmacy: n/a  If necessary, Name of Provider/Nurse Contacted: [place] ,PharmD Clinical Pharmacist  02/08/2022  2:05 PM

## 2022-02-08 NOTE — H&P (Addendum)
History and Physical   Nathaniel Nguyen WCB:[id number] DOB: September 13, 1945 DOA: 02/08/2022  PCP: Nathaniel Harrier, MD  Outpatient Specialists: Dr. [facility] cardiology; Dr. Amaryllis Nguyen, [facility] oncology Patient coming from: Home via [place]  I have personally briefly reviewed patient's old medical records in [place].  Chief Concern: Chills, weakness, diarrhea, fever  HPI: [name] is a 76 year old male with history of gout, primary hypertension, ED,Renal neoplasm, CAD, CKD stage IV, anemia of chronic kidney disease, who presents emergency department for chief concerns of chills, weakness, diarrhea, and subjective fever.  Initial vitals in the emergency department showed temperature of 98.2, respiration 18, heart rate of 90, blood pressure 99/56 and improved to 114/55, SPO2 of 98% on room air.  Serum sodium 137, potassium 5 .5, serum chloride 111, bicarb 20, BUN of 43, serum creatinine of 3.73, GFR 16, nonfasting blood glucose 67, WBC 22.9, hemoglobin 9.8, platelets of 127.  UA showed moderate leukocytes.  Blood cultures x2 and urine culture are in process.  ED treatment: Ceftriaxone 2 g IV, metronidazole 500 mg IV one-time dose, vancomycin per pharmacy, LR 1 L bolus, sodium chloride 1 L bolus, and additional LR bolus for sepsis fluid resuscitation.  LR infusion at 150 mL/h, IV, 20 hours have been ordered as well.  At bedside, he is able to tell me his name, age, current location, and current calendar year.  His 2 daughters and his partner/significant other was at bedside with patient's permission  He reports the chills and diarrhea that started last evening. He endroses polyuria, that started one week. He endorses dysuria and hematuria that started one week ago. Tmax was 104 at home this AM.  He reports he is never felt this way before.  He denies known sick contacts.  He denies nausea, vomiting, chest pain, shortness of breath, abdominal.   Social history: He  lives with his youngest daughter, Nathaniel Nguyen. He denies tobacco, recreational drug use. He infrequently drinks etoh, last drink was last evening, [employer], one shot. He is retired and formerly worked in [employer].   ROS: Constitutional: no weight change, + fever, + chills ENT/Mouth: no sore throat, no rhinorrhea Eyes: no eye pain, no vision changes Cardiovascular: no chest pain, no dyspnea,  no edema, no palpitations Respiratory: no cough, no sputum, no wheezing Gastrointestinal: no nausea, no vomiting, no diarrhea, no constipation Genitourinary: no urinary incontinence, + dysuria, + hematuria Musculoskeletal: no arthralgias, no myalgias Skin: no skin lesions, no pruritus, Neuro: + weakness, no loss of consciousness, no syncope Psych: no anxiety, no depression, + decrease appetite Heme/Lymph: no bruising, no bleeding  ED Course: Discussed with emergency medicine provider, patient requiring hospitalization for chief concerns of sepsis.  Assessment/Plan  Principal Problem:   Severe sepsis (HCC) Active Problems:   Type 2 diabetes mellitus with hyperlipidemia (HCC)   Coronary artery disease   Essential hypertension   Anemia of chronic disease   Diarrhea   UTI (urinary tract infection)   Renal cell carcinoma of left kidney (HCC)   Assessment and Plan:  * Severe sepsis ([place]) - Etiology work-up in progress, presumptive diagnosis at this time is UTI however given diarrhea and markedly elevated leukocytosis, infectious diarrhea cannot be excluded at this time - Check C. difficile PCR, GI panel - Urine culture, blood cultures x2 are in process - Continue with broad-spectrum antibiotic including vancomycin, cefepime, metronidazole - Status post aggressive fluid hydration per sepsis bolus protocol, patient is maintaining appropriate MAP of greater than 65 -  Admit to telemetry medical, observation  Renal cell carcinoma of left kidney ([place]) - Please continue follow-up  with hematology/oncology outpatient  UTI (urinary tract infection) - Present on admission - Urine culture in process - Continue with antibiotic including cefepime  Diarrhea - With markedly elevated leukocytosis - We will check for C. difficile by PCR and GI panel  Anemia of chronic disease - Baseline hemoglobin over the last year has been 8.5-9.2 - Hemoglobin on admission is 9.8 - CBC in the  Essential hypertension - Resumed home amlodipine 5 mg daily, metoprolol succinate 50 mg p.o. twice daily for 02/09/2022  Coronary artery disease - Aspirin 81 mg daily resumed  Type 2 diabetes mellitus with hyperlipidemia (HCC) - Atorvastatin 40 mg daily resumed - Insulin SSI with at bedtime coverage ordered - I have not resumed home, p.o. antihyperglycemic agents, glipizide - Goal inpatient blood glucose levels 140-180  Chart reviewed.   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 His smoking use included cigarettes. He has a 12.00 pack-year smoking history. He has never used smokeless tobacco. He reports current alcohol use of about 6.0 standard drinks of alcohol per week. He reports that he does not use drugs.  Allergies  Allergen Reactions   Metformin And Related    Family History  Problem Relation Age of Onset   Diabetes Mother    Family history: Family history reviewed and not pertinent.  Prior to Admission medications   Medication Sig Start Date End Date Taking? Authorizing Provider  amLODipine (NORVASC) 5 MG tablet Take 5 mg by mouth daily.   Yes [provider]  aspirin 81 MG chewable tablet Chew 81 mg by mouth daily.   Yes [provider]  atorvastatin (LIPITOR) 40 MG tablet Take 40 mg by mouth daily.   Yes [provider]  glimepiride (AMARYL) 2 MG tablet Take 2 mg by mouth daily.   Yes [provider]  metoprolol succinate (TOPROL-XL) 100 MG 24 hr tablet Take 50 mg by mouth 2 (two) times daily.   Yes [provider]  timolol (TIMOPTIC) 0.25 % ophthalmic solution Place 1 drop into the left eye in the morning.   Yes [provider]   Physical Exam: Vitals:   02/08/22 1310 02/08/22 1450 02/08/22 1533 02/08/22 1603  BP:  (!) 114/55  (!) 113/57  Pulse:  81  81  Resp:  18  17  Temp: 98.2 F (36.8 C)     TempSrc: Oral     SpO2:  99%  100%  Weight:   76.2 kg   Height:       Constitutional: appears age-appropriate, frail, NAD, calm, comfortable Eyes: PERRL, lids and conjunctivae normal ENMT: Mucous membranes are moist. Posterior pharynx clear of any exudate or lesions. Age-appropriate dentition. Hearing appropriate Neck: normal, supple, no masses, no thyromegaly Respiratory: clear to auscultation bilaterally, no wheezing, no crackles. Normal respiratory effort. No accessory muscle use.  Cardiovascular: Regular rate  and rhythm, no murmurs / rubs / gallops. No extremity edema. 2+ pedal pulses. No carotid bruits.  Abdomen: no tenderness, no masses palpated, no hepatosplenomegaly. Bowel sounds positive.  Musculoskeletal: no clubbing / cyanosis. No joint deformity upper and lower extremities. Good ROM, no contractures, no atrophy. Normal muscle tone.  Skin: no rashes, lesions, ulcers. No induration Neurologic: Sensation intact. Strength 5/5 in all 4.  Psychiatric: Normal judgment and insight. Alert and oriented x 3. Normal mood.   EKG: independently reviewed, showing sinus rhythm with rate of 90, QTc 440  Chest x-ray on Admission: I personally reviewed and I agree with radiologist reading as below.  CT Abdomen Pelvis Wo Contrast  Result Date: 02/08/2022 CLINICAL DATA:  Abdominal pain, acute, nonlocalized diarrhea, leukocytosis EXAM: CT ABDOMEN AND PELVIS WITHOUT CONTRAST TECHNIQUE: Multidetector CT imaging of the abdomen and pelvis was performed following the standard protocol without IV contrast. RADIATION DOSE REDUCTION: This exam was performed according to the departmental dose-optimization program which includes automated exposure control, adjustment of the mA and/or kV according to patient size and/or use of iterative reconstruction technique. COMPARISON:  02/06/2008 FINDINGS: Lower chest: Bibasilar subsegmental atelectasis. Heart size within normal limits. Relative hypoattenuation of the cardiac blood pool indicative of anemia. Hepatobiliary: No focal liver abnormality is seen. Status post cholecystectomy. No biliary dilatation. Pancreas: Unremarkable. No pancreatic ductal dilatation or surrounding inflammatory changes. Spleen: Normal in size without focal abnormality. Adrenals/Urinary Tract: Unremarkable adrenal glands. Mild bilateral perinephric stranding, nonspecific. Mild bilateral hydronephrosis. No renal or ureteral calculi identified. No solid renal lesion identified. Circumferential wall thickening of the  urinary bladder. Stomach/Bowel: Stomach is within normal limits. Appendix appears normal (series 2, image 53). Scattered colonic diverticulosis. No evidence of bowel wall thickening, distention, or inflammatory changes. Vascular/Lymphatic: Scattered aortoiliac atherosclerotic calcifications without aneurysm. No abdominopelvic lymphadenopathy. Reproductive: Prostatomegaly. Other: No free fluid. No abdominopelvic fluid collection. No pneumoperitoneum. No abdominal wall hernia. Musculoskeletal: No acute or significant osseous findings. IMPRESSION: 1. Circumferential wall thickening of the urinary bladder, which may be related to chronic outlet obstruction or cystitis. Correlate with urinalysis. 2. Mild bilateral hydronephrosis with mild bilateral perinephric stranding. No renal or ureteral calculi identified. Findings could be related to outlet obstruction or ascending urinary tract infection. 3. Prostatomegaly. 4. Colonic diverticulosis without evidence of acute diverticulitis. 5. Aortic atherosclerosis (ICD10-I70.0). Electronically Signed   By: Davina Poke D.O.   On: 02/08/2022 16:20   DG Chest Port 1 View  Result Date: 02/08/2022 CLINICAL DATA:  Fever and chills. EXAM: PORTABLE CHEST 1 VIEW COMPARISON:  12/29/2020 and prior radiographs FINDINGS: The cardiomediastinal silhouette is unremarkable. There is no evidence of focal airspace disease, pulmonary edema, suspicious pulmonary nodule/mass, pleural effusion, or pneumothorax. No acute bony abnormalities are identified. IMPRESSION: No active disease. Electronically Signed   By: Margarette Canada M.D.   On: 02/08/2022 14:53    Labs on Admission: I have personally reviewed  following labs  CBC: Recent Labs  Lab 02/08/22 1322  WBC 22.9*  HGB 9.8*  HCT 31.3*  MCV 83.7  PLT 396*   Basic Metabolic Panel: Recent Labs  Lab 02/08/22 1322  NA 137  K 5.5*  CL 111  CO2 20*  GLUCOSE 67*  BUN 43*  CREATININE 3.73*  CALCIUM 8.3*   GFR: Estimated  Creatinine Clearance: 15.8 mL/min (A) (by C-G formula based on SCr of 3.73 mg/dL (H)).  Coagulation Profile: Recent Labs  Lab 02/08/22 1440  INR 1.4*   Urine analysis:    Component Value Date/Time   COLORURINE YELLOW (A) 02/08/2022 1322   APPEARANCEUR TURBID (A) 02/08/2022 1322   APPEARANCEUR Hazy 07/29/2013 0755   LABSPEC 1.016 02/08/2022 1322   LABSPEC 1.013 07/29/2013 0755   PHURINE 5.0 02/08/2022 1322   GLUCOSEU NEGATIVE 02/08/2022 1322   GLUCOSEU 50 mg/dL 07/29/2013 0755   HGBUR LARGE (A) 02/08/2022 1322   BILIRUBINUR NEGATIVE 02/08/2022 1322   BILIRUBINUR Negative 07/29/2013 0755   KETONESUR NEGATIVE 02/08/2022 1322   PROTEINUR 100 (A) 02/08/2022 1322   NITRITE NEGATIVE 02/08/2022 1322   LEUKOCYTESUR MODERATE (A) 02/08/2022 1322   LEUKOCYTESUR Negative 07/29/2013 0755   Dr. Tobie Poet [facility]  If 7PM-7AM, please contact overnight-coverage provider If 7AM-7PM, please contact day coverage provider [email or web address]  02/08/2022, 5:35 PM

## 2022-02-08 NOTE — Assessment & Plan Note (Signed)
-   Resumed home amlodipine 5 mg daily, metoprolol succinate 50 mg p.o. twice daily for 02/09/2022

## 2022-02-08 NOTE — Sepsis Progress Note (Signed)
Sepsis protocol is being followed by [facility]. 

## 2022-02-08 NOTE — Assessment & Plan Note (Signed)
-   Baseline hemoglobin over the last year has been 8.5-9.2 - Hemoglobin on admission is 9.8 - CBC in the

## 2022-02-08 NOTE — Progress Notes (Addendum)
Pharmacy Antibiotic Note  Nathaniel Nguyen is a 76 y.o. male admitted on 02/08/2022 with sepsis.  Past medical history includes CKD stage 4, CAD, T2DM. Pharmacy has been consulted for vancomycin and cefepime dosing.  Presented febrile, tachycardic and hypotensive. Pyuria on UA. Creatinine of 3.73 appears to be at baseline.   Plan: Vancomycin 1,500 mg loading dose x 1 Dosed conservatively given advanced CKD Vancomycin 1,000 mg every 48 hours Goal AUC 400-600 Estimated AUC 520.6, Cmin 14.1 mcg/mL  Cefepime 2 grams every 24 hours  Monitor renal function, culture results, and possible source.  Height: '5\' 8"'$  (172.7 cm) IBW/kg (Calculated) : 68.4  Temp (24hrs), Avg:98.2 F (36.8 C), Min:98.2 F (36.8 C), Max:98.2 F (36.8 C)  Recent Labs  Lab 02/08/22 1322 02/08/22 1440  WBC 22.9*  --   CREATININE 3.73*  --   LATICACIDVEN  --  2.6*    CrCl 15.8 mL/min  Allergies  Allergen Reactions   Metformin And Related     Antimicrobials this admission: ceftriaxone 7/15 >>  metronidazole 7/15 >>   Dose adjustments this admission: N/a  Microbiology results: 7/15 BCx: in process  7/15 UCx: in process   Thank you for allowing pharmacy to be a part of this patient's care.  Wynelle Cleveland 02/08/2022 3:31 PM

## 2022-02-08 NOTE — Assessment & Plan Note (Signed)
-   With markedly elevated leukocytosis - We will check for C. difficile by PCR and GI panel

## 2022-02-08 NOTE — Assessment & Plan Note (Signed)
-   Please continue follow-up with hematology/oncology outpatient

## 2022-02-08 NOTE — Assessment & Plan Note (Signed)
-   Etiology work-up in progress, presumptive diagnosis at this time is UTI however given diarrhea and markedly elevated leukocytosis, infectious diarrhea cannot be excluded at this time - Check C. difficile PCR, GI panel - Urine culture, blood cultures x2 are in process - Continue with broad-spectrum antibiotic including vancomycin, cefepime, metronidazole - Status post aggressive fluid hydration per sepsis bolus protocol, patient is maintaining appropriate MAP of greater than 65 - Admit to telemetry medical, observation

## 2022-02-08 NOTE — ED Notes (Signed)
Pt pants changed and linens replaced- pt repositioned in bed

## 2022-02-08 NOTE — Hospital Course (Addendum)
[name] is a 76 year old male with history of gout, primary hypertension, ED, renal neoplasm of left kidney (new diagnosis in June 2023), status post cholecystectomy, CAD, CKD stage IV, anemia of chronic kidney disease, who presents emergency department for chief concerns of chills, weakness, diarrhea, and subjective fever.  Initial vitals in the emergency department showed temperature of 98.2, respiration 18, heart rate of 90, blood pressure 99/56 and improved to 114/55, SPO2 of 98% on room air.  Serum sodium 137, potassium 5 .5, serum chloride 111, bicarb 20, BUN of 43, serum creatinine of 3.73, GFR 16, nonfasting blood glucose 67, WBC 22.9, hemoglobin 9.8, platelets of 127.  UA showed moderate leukocytes.  Blood cultures x2 and urine culture are in process.  ED treatment: Ceftriaxone 2 g IV, metronidazole 500 mg IV one-time dose, vancomycin per pharmacy, LR 1 L bolus, sodium chloride 1 L bolus, and additional LR bolus for sepsis fluid resuscitation.  LR infusion at 150 mL/h, IV, 20 hours have been ordered as well.

## 2022-02-08 NOTE — Assessment & Plan Note (Signed)
-   Aspirin 81 mg daily resumed

## 2022-02-08 NOTE — Assessment & Plan Note (Signed)
-   Present on admission - Urine culture in process - Continue with antibiotic including cefepime

## 2022-02-09 DIAGNOSIS — Z20822 Contact with and (suspected) exposure to covid-19: Secondary | ICD-10-CM | POA: Diagnosis present

## 2022-02-09 DIAGNOSIS — N401 Enlarged prostate with lower urinary tract symptoms: Secondary | ICD-10-CM | POA: Diagnosis present

## 2022-02-09 DIAGNOSIS — E1169 Type 2 diabetes mellitus with other specified complication: Secondary | ICD-10-CM | POA: Diagnosis present

## 2022-02-09 DIAGNOSIS — N184 Chronic kidney disease, stage 4 (severe): Secondary | ICD-10-CM | POA: Diagnosis present

## 2022-02-09 DIAGNOSIS — I252 Old myocardial infarction: Secondary | ICD-10-CM | POA: Diagnosis not present

## 2022-02-09 DIAGNOSIS — N138 Other obstructive and reflux uropathy: Secondary | ICD-10-CM | POA: Diagnosis present

## 2022-02-09 DIAGNOSIS — I129 Hypertensive chronic kidney disease with stage 1 through stage 4 chronic kidney disease, or unspecified chronic kidney disease: Secondary | ICD-10-CM | POA: Diagnosis present

## 2022-02-09 DIAGNOSIS — R6520 Severe sepsis without septic shock: Secondary | ICD-10-CM | POA: Diagnosis present

## 2022-02-09 DIAGNOSIS — E7800 Pure hypercholesterolemia, unspecified: Secondary | ICD-10-CM | POA: Diagnosis present

## 2022-02-09 DIAGNOSIS — R197 Diarrhea, unspecified: Secondary | ICD-10-CM | POA: Diagnosis present

## 2022-02-09 DIAGNOSIS — A419 Sepsis, unspecified organism: Secondary | ICD-10-CM | POA: Diagnosis present

## 2022-02-09 DIAGNOSIS — I2510 Atherosclerotic heart disease of native coronary artery without angina pectoris: Secondary | ICD-10-CM | POA: Diagnosis present

## 2022-02-09 DIAGNOSIS — D631 Anemia in chronic kidney disease: Secondary | ICD-10-CM | POA: Diagnosis present

## 2022-02-09 DIAGNOSIS — I700 Atherosclerosis of aorta: Secondary | ICD-10-CM | POA: Diagnosis present

## 2022-02-09 DIAGNOSIS — M109 Gout, unspecified: Secondary | ICD-10-CM | POA: Diagnosis present

## 2022-02-09 DIAGNOSIS — H409 Unspecified glaucoma: Secondary | ICD-10-CM | POA: Diagnosis present

## 2022-02-09 DIAGNOSIS — Z888 Allergy status to other drugs, medicaments and biological substances status: Secondary | ICD-10-CM | POA: Diagnosis not present

## 2022-02-09 DIAGNOSIS — N179 Acute kidney failure, unspecified: Secondary | ICD-10-CM | POA: Diagnosis present

## 2022-02-09 DIAGNOSIS — E1122 Type 2 diabetes mellitus with diabetic chronic kidney disease: Secondary | ICD-10-CM | POA: Diagnosis present

## 2022-02-09 DIAGNOSIS — C642 Malignant neoplasm of left kidney, except renal pelvis: Secondary | ICD-10-CM | POA: Diagnosis present

## 2022-02-09 DIAGNOSIS — Z87891 Personal history of nicotine dependence: Secondary | ICD-10-CM | POA: Diagnosis not present

## 2022-02-09 DIAGNOSIS — A4151 Sepsis due to Escherichia coli [E. coli]: Secondary | ICD-10-CM | POA: Diagnosis present

## 2022-02-09 DIAGNOSIS — N136 Pyonephrosis: Secondary | ICD-10-CM | POA: Diagnosis present

## 2022-02-09 DIAGNOSIS — R319 Hematuria, unspecified: Secondary | ICD-10-CM | POA: Diagnosis present

## 2022-02-09 DIAGNOSIS — I429 Cardiomyopathy, unspecified: Secondary | ICD-10-CM | POA: Diagnosis present

## 2022-02-09 LAB — BLOOD CULTURE ID PANEL (REFLEXED) - BCID2

## 2022-02-09 LAB — GASTROINTESTINAL PANEL BY PCR, STOOL (REPLACES STOOL CULTURE)

## 2022-02-09 LAB — C DIFFICILE QUICK SCREEN W PCR REFLEX
C Diff antigen: NEGATIVE
C Diff interpretation: NOT DETECTED
C Diff toxin: NEGATIVE

## 2022-02-09 LAB — HEMOGLOBIN A1C
Hgb A1c MFr Bld: 8.2 % — ABNORMAL HIGH (ref 4.8–5.6)
Mean Plasma Glucose: 188.64 mg/dL

## 2022-02-09 LAB — GLUCOSE, CAPILLARY
Glucose-Capillary: 124 mg/dL — ABNORMAL HIGH (ref 70–99)
Glucose-Capillary: 64 mg/dL — ABNORMAL LOW (ref 70–99)
Glucose-Capillary: 73 mg/dL (ref 70–99)
Glucose-Capillary: 130 mg/dL — ABNORMAL HIGH (ref 70–99)
Glucose-Capillary: 81 mg/dL (ref 70–99)

## 2022-02-09 LAB — CORTISOL-AM, BLOOD: Cortisol - AM: 16.4 ug/dL (ref 6.7–22.6)

## 2022-02-09 LAB — PROTIME-INR
INR: 1.6 — ABNORMAL HIGH (ref 0.8–1.2)
Prothrombin Time: 19.1 seconds — ABNORMAL HIGH (ref 11.4–15.2)

## 2022-02-09 MED ORDER — SODIUM CHLORIDE 0.9 % IV SOLN
2.0000 g | INTRAVENOUS | Status: AC
  Administered 2022-02-09 – 2022-02-11 (×3): 2 g via INTRAVENOUS
  Filled 2022-02-09: qty 2
  Filled 2022-02-09 (×3): qty 20

## 2022-02-09 MED ORDER — LACTATED RINGERS IV SOLN: INTRAVENOUS | Status: DC

## 2022-02-09 NOTE — Progress Notes (Signed)
PHARMACY - PHYSICIAN COMMUNICATION CRITICAL VALUE ALERT - BLOOD CULTURE IDENTIFICATION (BCID)  Nathaniel Nguyen is an 76 y.o. male who presented to [facility] on 02/08/2022 with a chief complaint of sepsis  Assessment:  1/4 bottles GNR. BCID detected E coli, no resistance. Suspect urinary source  Name of physician (or Provider) Contacted: Dr. Posey Pronto  Current antibiotics: Cefepime  Changes to prescribed antibiotics recommended:  Narrow to ceftriaxone  Results for orders placed or performed during the hospital encounter of 02/08/22  Blood Culture ID Panel (Reflexed) (Collected: 02/08/2022  2:10 PM)  Result Value Ref Range   Enterococcus faecalis NOT DETECTED NOT DETECTED   Enterococcus Faecium NOT DETECTED NOT DETECTED   Listeria monocytogenes NOT DETECTED NOT DETECTED   Staphylococcus species NOT DETECTED NOT DETECTED   Staphylococcus aureus (BCID) NOT DETECTED NOT DETECTED   Staphylococcus epidermidis NOT DETECTED NOT DETECTED   Staphylococcus lugdunensis NOT DETECTED NOT DETECTED   Streptococcus species NOT DETECTED NOT DETECTED   Streptococcus agalactiae NOT DETECTED NOT DETECTED   Streptococcus pneumoniae NOT DETECTED NOT DETECTED   Streptococcus pyogenes NOT DETECTED NOT DETECTED   A.calcoaceticus-baumannii NOT DETECTED NOT DETECTED   Bacteroides fragilis NOT DETECTED NOT DETECTED   Enterobacterales DETECTED (A) NOT DETECTED   Enterobacter cloacae complex NOT DETECTED NOT DETECTED   Escherichia coli DETECTED (A) NOT DETECTED   Klebsiella aerogenes NOT DETECTED NOT DETECTED   Klebsiella oxytoca NOT DETECTED NOT DETECTED   Klebsiella pneumoniae NOT DETECTED NOT DETECTED   Proteus species NOT DETECTED NOT DETECTED   Salmonella species NOT DETECTED NOT DETECTED   Serratia marcescens NOT DETECTED NOT DETECTED   Haemophilus influenzae NOT DETECTED NOT DETECTED   Neisseria meningitidis NOT DETECTED NOT DETECTED   Pseudomonas aeruginosa NOT DETECTED NOT DETECTED   Stenotrophomonas  maltophilia NOT DETECTED NOT DETECTED   Candida albicans NOT DETECTED NOT DETECTED   Candida auris NOT DETECTED NOT DETECTED   Candida glabrata NOT DETECTED NOT DETECTED   Candida krusei NOT DETECTED NOT DETECTED   Candida parapsilosis NOT DETECTED NOT DETECTED   Candida tropicalis NOT DETECTED NOT DETECTED   Cryptococcus neoformans/gattii NOT DETECTED NOT DETECTED   CTX-M ESBL NOT DETECTED NOT DETECTED   Carbapenem resistance IMP NOT DETECTED NOT DETECTED   Carbapenem resistance KPC NOT DETECTED NOT DETECTED   Carbapenem resistance NDM NOT DETECTED NOT DETECTED   Carbapenem resist OXA 48 LIKE NOT DETECTED NOT DETECTED   Carbapenem resistance VIM NOT DETECTED NOT DETECTED    Benita Gutter 02/09/2022  9:01 AM

## 2022-02-09 NOTE — Progress Notes (Signed)
[facility] at [place] NAME: Nathaniel Nguyen    MR#:  [id number]  DATE OF BIRTH:  Aug 05, 1945  SUBJECTIVE:   no family at bedside. Patient came in from home yesterday with fever chills and diarrhea since Friday. Complains of hematuria as well. Tells me his urine is clearing out. No fever overnight. Overall seems to be stable. Blood pressure soft.   VITALS:  Blood pressure (!) 96/53, pulse 72, temperature 98 F (36.7 C), resp. rate 16, height '5\' 8"'$  (1.727 m), weight 76.2 kg, SpO2 99 %.  PHYSICAL EXAMINATION:   GENERAL:  76 y.o.-year-old patient lying in the bed with no acute distress.  LUNGS: Normal breath sounds bilaterally, no wheezing, rales, rhonchi.  CARDIOVASCULAR: S1, S2 normal. No murmurs, rubs, or gallops.  ABDOMEN: Soft, nontender, nondistended. Bowel sounds present.  EXTREMITIES: No  edema b/l.    NEUROLOGIC: nonfocal  patient is alert and awake SKIN: No obvious rash, lesion, or ulcer.   LABORATORY PANEL:  CBC Recent Labs  Lab 02/08/22 1322  WBC 22.9*  HGB 9.8*  HCT 31.3*  PLT 127*    Chemistries  Recent Labs  Lab 02/08/22 2054  NA 140  K 4.5  CL 110  CO2 21*  GLUCOSE 146*  BUN 42*  CREATININE 3.11*  CALCIUM 7.8*  AST 31  ALT 19  ALKPHOS 67  BILITOT 1.2   Cardiac Enzymes No results for input(s): "TROPONINI" in the last 168 hours. RADIOLOGY:  CT Abdomen Pelvis Wo Contrast  Result Date: 02/08/2022 CLINICAL DATA:  Abdominal pain, acute, nonlocalized diarrhea, leukocytosis EXAM: CT ABDOMEN AND PELVIS WITHOUT CONTRAST TECHNIQUE: Multidetector CT imaging of the abdomen and pelvis was performed following the standard protocol without IV contrast. RADIATION DOSE REDUCTION: This exam was performed according to the departmental dose-optimization program which includes automated exposure control, adjustment of the mA and/or kV according to patient size and/or use of iterative reconstruction technique. COMPARISON:   02/06/2008 FINDINGS: Lower chest: Bibasilar subsegmental atelectasis. Heart size within normal limits. Relative hypoattenuation of the cardiac blood pool indicative of anemia. Hepatobiliary: No focal liver abnormality is seen. Status post cholecystectomy. No biliary dilatation. Pancreas: Unremarkable. No pancreatic ductal dilatation or surrounding inflammatory changes. Spleen: Normal in size without focal abnormality. Adrenals/Urinary Tract: Unremarkable adrenal glands. Mild bilateral perinephric stranding, nonspecific. Mild bilateral hydronephrosis. No renal or ureteral calculi identified. No solid renal lesion identified. Circumferential wall thickening of the urinary bladder. Stomach/Bowel: Stomach is within normal limits. Appendix appears normal (series 2, image 53). Scattered colonic diverticulosis. No evidence of bowel wall thickening, distention, or inflammatory changes. Vascular/Lymphatic: Scattered aortoiliac atherosclerotic calcifications without aneurysm. No abdominopelvic lymphadenopathy. Reproductive: Prostatomegaly. Other: No free fluid. No abdominopelvic fluid collection. No pneumoperitoneum. No abdominal wall hernia. Musculoskeletal: No acute or significant osseous findings. IMPRESSION: 1. Circumferential wall thickening of the urinary bladder, which may be related to chronic outlet obstruction or cystitis. Correlate with urinalysis. 2. Mild bilateral hydronephrosis with mild bilateral perinephric stranding. No renal or ureteral calculi identified. Findings could be related to outlet obstruction or ascending urinary tract infection. 3. Prostatomegaly. 4. Colonic diverticulosis without evidence of acute diverticulitis. 5. Aortic atherosclerosis (ICD10-I70.0). Electronically Signed   By: Davina Poke D.O.   On: 02/08/2022 16:20   DG Chest Port 1 View  Result Date: 02/08/2022 CLINICAL DATA:  Fever and chills. EXAM: PORTABLE CHEST 1 VIEW COMPARISON:  12/29/2020 and prior radiographs FINDINGS: The  cardiomediastinal silhouette is unremarkable. There is no evidence of focal airspace disease, pulmonary  edema, suspicious pulmonary nodule/mass, pleural effusion, or pneumothorax. No acute bony abnormalities are identified. IMPRESSION: No active disease. Electronically Signed   By: Margarette Canada M.D.   On: 02/08/2022 14:53    Assessment and Plan  Nathaniel Nguyen is a 76 year old male with history of gout, primary hypertension, ED, Renal neoplasm, CAD, CKD stage IV, anemia of chronic kidney disease, who presents emergency department for chief concerns of chills, weakness, diarrhea, and subjective fever. Tmax 104 at home.  Severe sepsis (HCC)/E coli--POA UTI/Chronic bladder outlet obstruction/BPH/bilateral pyelonephritis -  C. difficile PCR, GI panel--yet to get stool sample - BCID e coli - De-escalate to IV rocephin (d/w pharmacy) - continue IV fluids. -- CT abdomen pelvis Circumferential wall thickening of the urinary bladder, which may be related to chronic outlet obstruction or cystitis. Correlate with urinalysis. 2. Mild bilateral hydronephrosis with mild bilateral perinephric stranding. No renal or ureteral calculi identified. Findings could be related to outlet obstruction or ascending urinary tract infection. 3. Prostatomegaly. 4. Colonic diverticulosis without evidence of acute diverticulitis. 5. Aortic atherosclerosis (ICD10-I70 -- consider urology consultation. Will continue to monitor input output  Renal cell carcinoma of left kidney (HCC) - Please continue follow-up with hematology/oncology/urology outpatient at [facility]  acute on chronic kidney disease stage IV -- continue IV fluids -- baseline creatinine 2.8-- 2.9 -- came in with creatinine of 3.73--3.3 -- patient follows with [facility] nephrology   Diarrhea - With markedly elevated leukocytosis - We will check for C. difficile by PCR and GI panel-- no stool sample obtained yet   Anemia of chronic disease - Baseline hemoglobin  over the last year has been 8.5-9.2 - Hemoglobin on admission is 9.8   Essential hypertension - blood pressure soft. Will hold amlodipine and metoprolol  Coronary artery disease - Aspirin 81 mg daily resumed   Type 2 diabetes mellitus with hyperlipidemia (HCC) - Atorvastatin 40 mg daily resumed - Insulin SSI with at bedtime coverage ordered - hold PO glipizide for now   Family communication : daughter on the phone Consults : none  CODE STATUS: full DVT Prophylaxis :Heparin Level of care: Med-Surg Status is: Inpatient Remains inpatient appropriate because: continued sepsis treatment    TOTAL TIME TAKING CARE OF THIS PATIENT: 35 minutes.  >50% time spent on counselling and coordination of care  Note: This dictation was prepared with Dragon dictation along with smaller phrase technology. Any transcriptional errors that result from this process are unintentional.  Fritzi Mandes M.D    [facility]   CC: Primary care physician; Tracie Harrier, MD

## 2022-02-09 NOTE — Plan of Care (Signed)

## 2022-02-09 NOTE — Plan of Care (Signed)
  Problem: Elimination: Goal: Will not experience complications related to bowel motility Outcome: Progressing   Problem: Safety: Goal: Ability to remain free from injury will improve Outcome: Progressing   

## 2022-02-10 DIAGNOSIS — A419 Sepsis, unspecified organism: Secondary | ICD-10-CM | POA: Diagnosis not present

## 2022-02-10 DIAGNOSIS — R6520 Severe sepsis without septic shock: Secondary | ICD-10-CM | POA: Diagnosis not present

## 2022-02-10 LAB — GLUCOSE, CAPILLARY
Glucose-Capillary: 44 mg/dL — CL (ref 70–99)
Glucose-Capillary: 85 mg/dL (ref 70–99)
Glucose-Capillary: 127 mg/dL — ABNORMAL HIGH (ref 70–99)
Glucose-Capillary: 183 mg/dL — ABNORMAL HIGH (ref 70–99)
Glucose-Capillary: 171 mg/dL — ABNORMAL HIGH (ref 70–99)

## 2022-02-10 MED ORDER — AMLODIPINE BESYLATE 5 MG PO TABS
5.0000 mg | ORAL_TABLET | Freq: Every day | ORAL | Status: DC
  Administered 2022-02-10 – 2022-02-11 (×2): 5 mg via ORAL
  Filled 2022-02-10 (×2): qty 1

## 2022-02-10 NOTE — Plan of Care (Signed)
  Problem: Fluid Volume: Goal: Hemodynamic stability will improve Outcome: Progressing   Problem: Clinical Measurements: Goal: Diagnostic test results will improve Outcome: Progressing Goal: Signs and symptoms of infection will decrease Outcome: Progressing   Problem: Respiratory: Goal: Ability to maintain adequate ventilation will improve Outcome: Progressing   Problem: Coping: Goal: Ability to adjust to condition or change in health will improve Outcome: Progressing   Problem: Fluid Volume: Goal: Ability to maintain a balanced intake and output will improve Outcome: Progressing   Problem: Health Behavior/Discharge Planning: Goal: Ability to identify and utilize available resources and services will improve Outcome: Progressing   Problem: Clinical Measurements: Goal: Ability to maintain clinical measurements within normal limits will improve Outcome: Progressing Goal: Will remain free from infection Outcome: Progressing Goal: Diagnostic test results will improve Outcome: Progressing Goal: Cardiovascular complication will be avoided Outcome: Progressing   Problem: Activity: Goal: Risk for activity intolerance will decrease Outcome: Progressing

## 2022-02-10 NOTE — Inpatient Diabetes Management (Signed)
Inpatient Diabetes Program Recommendations  AACE/ADA: New Consensus Statement on Inpatient Glycemic Control (2015)  Target Ranges:  Prepandial:   less than 140 mg/dL      Peak postprandial:   less than 180 mg/dL (1-2 hours)      Critically ill patients:  140 - 180 mg/dL    Latest Reference Range & Units 02/09/22 07:41 02/09/22 12:16 02/09/22 13:07 02/09/22 16:07 02/09/22 20:54  Glucose-Capillary 70 - 99 mg/dL 124 (H) 64 (L) 73 130 (H) 81    Latest Reference Range & Units 02/10/22 08:00  Glucose-Capillary 70 - 99 mg/dL 44 (LL)  (LL): Data is critically low     Home DM Meds: Amaryl 2 mg daily  Current Orders: Novolog Sensitive Correction Scale/ SSI (0-9 units) TID AC + HS   MD- Note Hypoglycemia.    Please consider stopping Novolog SSI for now but please continue CBG checks TID AC + HS    --Will follow patient during hospitalization--  Wyn Quaker RN, MSN, [place] Diabetes Coordinator Inpatient Glycemic Control Team Team Pager: [phone] (8a-5p)

## 2022-02-10 NOTE — Plan of Care (Signed)

## 2022-02-10 NOTE — Progress Notes (Signed)
[place] at [place] NAME: Nathaniel Nguyen    MR#:  [id number]  DATE OF BIRTH:  08-16-1945  SUBJECTIVE:   Pt's dter at bedside. Patient came in from home yesterday with fever chills and diarrhea since Friday. Complains of hematuria as well. Tells me his urine is clearing out. No fever overnight. Overall seems to be stable.  Patient wondering when he can go home.  VITALS:  Blood pressure (!) 142/74, pulse 95, temperature 97.9 F (36.6 C), resp. rate 18, height '5\' 8"'$  (1.727 m), weight 76.2 kg, SpO2 98 %.  PHYSICAL EXAMINATION:   GENERAL:  76 y.o.-year-old patient lying in the bed with no acute distress.  LUNGS: Normal breath sounds bilaterally, no wheezing, rales, rhonchi.  CARDIOVASCULAR: S1, S2 normal. No murmurs, rubs, or gallops.  ABDOMEN: Soft, nontender, nondistended. Bowel sounds present.  EXTREMITIES: No  edema b/l.    NEUROLOGIC: nonfocal  patient is alert and awake SKIN: No obvious rash, lesion, or ulcer.   LABORATORY PANEL:  CBC Recent Labs  Lab 02/08/22 1322  WBC 22.9*  HGB 9.8*  HCT 31.3*  PLT 127*     Chemistries  Recent Labs  Lab 02/08/22 2054  NA 140  K 4.5  CL 110  CO2 21*  GLUCOSE 146*  BUN 42*  CREATININE 3.11*  CALCIUM 7.8*  AST 31  ALT 19  ALKPHOS 67  BILITOT 1.2    Cardiac Enzymes No results for input(s): "TROPONINI" in the last 168 hours. RADIOLOGY:  CT Abdomen Pelvis Wo Contrast  Result Date: 02/08/2022 CLINICAL DATA:  Abdominal pain, acute, nonlocalized diarrhea, leukocytosis EXAM: CT ABDOMEN AND PELVIS WITHOUT CONTRAST TECHNIQUE: Multidetector CT imaging of the abdomen and pelvis was performed following the standard protocol without IV contrast. RADIATION DOSE REDUCTION: This exam was performed according to the departmental dose-optimization program which includes automated exposure control, adjustment of the mA and/or kV according to patient size and/or use of iterative reconstruction  technique. COMPARISON:  02/06/2008 FINDINGS: Lower chest: Bibasilar subsegmental atelectasis. Heart size within normal limits. Relative hypoattenuation of the cardiac blood pool indicative of anemia. Hepatobiliary: No focal liver abnormality is seen. Status post cholecystectomy. No biliary dilatation. Pancreas: Unremarkable. No pancreatic ductal dilatation or surrounding inflammatory changes. Spleen: Normal in size without focal abnormality. Adrenals/Urinary Tract: Unremarkable adrenal glands. Mild bilateral perinephric stranding, nonspecific. Mild bilateral hydronephrosis. No renal or ureteral calculi identified. No solid renal lesion identified. Circumferential wall thickening of the urinary bladder. Stomach/Bowel: Stomach is within normal limits. Appendix appears normal (series 2, image 53). Scattered colonic diverticulosis. No evidence of bowel wall thickening, distention, or inflammatory changes. Vascular/Lymphatic: Scattered aortoiliac atherosclerotic calcifications without aneurysm. No abdominopelvic lymphadenopathy. Reproductive: Prostatomegaly. Other: No free fluid. No abdominopelvic fluid collection. No pneumoperitoneum. No abdominal wall hernia. Musculoskeletal: No acute or significant osseous findings. IMPRESSION: 1. Circumferential wall thickening of the urinary bladder, which may be related to chronic outlet obstruction or cystitis. Correlate with urinalysis. 2. Mild bilateral hydronephrosis with mild bilateral perinephric stranding. No renal or ureteral calculi identified. Findings could be related to outlet obstruction or ascending urinary tract infection. 3. Prostatomegaly. 4. Colonic diverticulosis without evidence of acute diverticulitis. 5. Aortic atherosclerosis (ICD10-I70.0). Electronically Signed   By: Davina Poke D.O.   On: 02/08/2022 16:20    Assessment and Plan  Nathaniel Nguyen is a 76 year old male with history of gout, primary hypertension, ED, Renal neoplasm, CAD, CKD stage IV,  anemia of chronic kidney disease, who presents emergency department for chief  concerns of chills, weakness, diarrhea, and subjective fever. Tmax 104 at home.  Severe sepsis (HCC)/E coli--POA UTI/Chronic bladder outlet obstruction/BPH/bilateral pyelonephritis -  C. difficile PCR, GI panel--yet to get stool sample - BCID e coli - De-escalate to IV rocephin (d/w pharmacy) - continue IV fluids. -- CT abdomen pelvis Circumferential wall thickening of the urinary bladder, which may be related to chronic outlet obstruction or cystitis. Correlate with urinalysis. 2. Mild bilateral hydronephrosis with mild bilateral perinephric stranding. No renal or ureteral calculi identified. Findings could be related to outlet obstruction or ascending urinary tract infection. 3. Prostatomegaly. 4. Colonic diverticulosis without evidence of acute diverticulitis. 5. Aortic atherosclerosis (ICD10-I70 --Patient is not have any urinary symptoms at present I have discussed with patient his his daughter about CT scan findings, enlarged prostate, changes of chronic outlet obstruction and recommend patient follows with urology as outpatient  Renal cell carcinoma of left kidney ([place]) - Please continue follow-up with hematology/oncology/urology outpatient at [facility]  acute on chronic kidney disease stage IV -- continue IV fluids -- baseline creatinine 2.8-- 2.9 -- came in with creatinine of 3.73--3.3 -- patient [facility]rology   Diarrhea - resolved. Patient unable to give any stool sample  Anemia of chronic disease - Baseline hemoglobin over the last year has been 8.5-9.2 - Hemoglobin on admission is 9.8   Essential hypertension - blood pressure improving will resume amlodipine Coronary artery disease - Aspirin 81 mg daily resumed   Type 2 diabetes mellitus with hyperlipidemia (HCC) - Atorvastatin 40 mg daily resumed - Insulin SSI with at bedtime coverage ordered - hold PO glipizide for  now   Family communication : daughter at bedside consults : none  CODE STATUS: full DVT Prophylaxis :Heparin Level of care: Med-Surg Status is: Inpatient Remains inpatient appropriate because: continued sepsis treatment if continues to show improvement will DC tomorrow. Daughter and patient agreeable.  TOTAL TIME TAKING CARE OF THIS PATIENT: 35 minutes.  >50% time spent on counselling and coordination of care  Note: This dictation was prepared with Dragon dictation along with smaller phrase technology. Any transcriptional errors that result from this process are unintentional.  Fritzi Mandes M.D    [facility]   CC: Primary care physician; Tracie Harrier, MD

## 2022-02-11 ENCOUNTER — Encounter: Payer: Self-pay | Admitting: Internal Medicine

## 2022-02-11 DIAGNOSIS — R6520 Severe sepsis without septic shock: Secondary | ICD-10-CM | POA: Diagnosis not present

## 2022-02-11 DIAGNOSIS — A419 Sepsis, unspecified organism: Secondary | ICD-10-CM | POA: Diagnosis not present

## 2022-02-11 LAB — URINE CULTURE: Culture: >=100000 — AB

## 2022-02-11 LAB — CBC
HCT: 26.5 % — ABNORMAL LOW (ref 39.0–52.0)
Hemoglobin: 8.5 g/dL — ABNORMAL LOW (ref 13.0–17.0)
MCH: 26.6 pg (ref 26.0–34.0)
MCHC: 32.1 g/dL (ref 30.0–36.0)
MCV: 82.8 fL (ref 80.0–100.0)
Platelets: 152 10*3/uL (ref 150–400)
RBC: 3.2 MIL/uL — ABNORMAL LOW (ref 4.22–5.81)
RDW: 15.2 % (ref 11.5–15.5)
WBC: 15 10*3/uL — ABNORMAL HIGH (ref 4.0–10.5)
nRBC: 0 % (ref 0.0–0.2)

## 2022-02-11 LAB — BASIC METABOLIC PANEL
Anion gap: 2 — ABNORMAL LOW (ref 5–15)
BUN: 36 mg/dL — ABNORMAL HIGH (ref 8–23)
CO2: 25 mmol/L (ref 22–32)
Calcium: 8.5 mg/dL — ABNORMAL LOW (ref 8.9–10.3)
Chloride: 115 mmol/L — ABNORMAL HIGH (ref 98–111)
Creatinine, Ser: 2.49 mg/dL — ABNORMAL HIGH (ref 0.61–1.24)
GFR, Estimated: 26 mL/min — ABNORMAL LOW (ref >60)
Glucose, Bld: 144 mg/dL — ABNORMAL HIGH (ref 70–99)
Potassium: 4.6 mmol/L (ref 3.5–5.1)
Sodium: 142 mmol/L (ref 135–145)

## 2022-02-11 LAB — GLUCOSE, CAPILLARY: Glucose-Capillary: 118 mg/dL — ABNORMAL HIGH (ref 70–99)

## 2022-02-11 MED ORDER — CEFADROXIL 500 MG PO CAPS: 1000.0000 mg | ORAL_CAPSULE | Freq: Two times a day (BID) | ORAL | Status: DC

## 2022-02-11 MED ORDER — CEFADROXIL 500 MG PO CAPS
1000.0000 mg | ORAL_CAPSULE | Freq: Two times a day (BID) | ORAL | 0 refills | Status: DC
Start: 2022-02-12 — End: 2022-02-11

## 2022-02-11 MED ORDER — CEFADROXIL 500 MG PO CAPS: 500.0000 mg | ORAL_CAPSULE | Freq: Two times a day (BID) | ORAL | Status: DC

## 2022-02-11 MED ORDER — CEFADROXIL 500 MG PO CAPS: 500.0000 mg | ORAL_CAPSULE | Freq: Two times a day (BID) | ORAL | 0 refills | Status: AC

## 2022-02-11 NOTE — Discharge Instructions (Signed)
Pt and daughter [name] advised to f/u [facility] hematology/Oncology in 2 -3 weeks

## 2022-02-11 NOTE — Discharge Summary (Addendum)
Physician Discharge Summary   Patient: Nathaniel Nguyen MRN: [id number] DOB: Dec 15, 1945  Admit date:     02/08/2022  Discharge date: 02/11/22  Discharge Physician: Fritzi Mandes   PCP: Tracie Harrier, MD   Recommendations at discharge:   follow-up [facility] hematology oncology in 2 to 3 week follow-up your primary care physician in 1 to 2 weeks  Discharge Diagnoses: E. coli sepsis E. coli UTI BPH/chronic prostatomegaly CKD stage IV  Hospital Course:  Nathaniel Nguyen is a 76 year old male with history of gout, primary hypertension, ED, Renal neoplasm, CAD, CKD stage IV, anemia of chronic kidney disease, who presents emergency department for chief concerns of chills, weakness, diarrhea, and subjective fever. Tmax 104 at home.   Severe sepsis (HCC)/E coli--POA UTI/Chronic bladder outlet obstruction/BPH/bilateral pyelonephritis -  C. difficile PCR, GI panel--yet to get stool sample - BCID e coli - De-escalate to IV rocephin (d/w pharmacy)--changed Cefadroxil (total 14 days). -- CT abdomen pelvis Circumferential wall thickening of the urinary bladder, which may be related to chronic outlet obstruction or cystitis. Correlate with urinalysis. 2. Mild bilateral hydronephrosis with mild bilateral perinephric stranding. No renal or ureteral calculi identified. Findings could be related to outlet obstruction or ascending urinary tract infection. 3. Prostatomegaly. 4. Colonic diverticulosis without evidence of acute diverticulitis. 5. Aortic atherosclerosis (ICD10-I70 --Patient is not have any urinary symptoms at present I have discussed with patient his his daughter about CT scan findings, enlarged prostate, changes of chronic outlet obstruction and recommend patient follow with urology as outpatient -- remains afebrile. White count down to 15 K (22K)  -- sepsis improved  Renal cell carcinoma of left kidney ([place]) - Please continue follow-up with hematology/oncology/urology outpatient at [facility]    Acute on chronic kidney disease stage IV -- recieved IV fluids -- baseline creatinine 2.8-- 2.9 -- came in with creatinine of 3.73--3.3--2.49 -- patient follows with [facility] nephrology   Diarrhea - resolved. Patient unable to give any stool sample   Anemia of chronic disease - Baseline hemoglobin over the last year has been 8.5-9.2 - Hemoglobin on admission is 9.8   Essential hypertension - blood pressure improving will resume amlodipine Coronary artery disease - Aspirin 81 mg daily resumed   Type 2 diabetes mellitus with hyperlipidemia (HCC) - Atorvastatin  - resume PO diabetes meds at discharge     Family communication :  plan discussed with daughter Nathaniel Nguyen on the phone  consults : none  CODE STATUS: full DVT Prophylaxis :Heparin       Disposition: Home Diet recommendation:  Discharge Diet Orders (From admission, onward)     Start     Ordered   02/11/22 0000  Diet - low sodium heart healthy        02/11/22 0853   02/11/22 0000  Diet Carb Modified        02/11/22 0853           Cardiac and Carb modified diet DISCHARGE MEDICATION: Allergies as of 02/11/2022       Reactions   Metformin And Related         Medication List     TAKE these medications    amLODipine 5 MG tablet Commonly known as: NORVASC Take 5 mg by mouth daily.   aspirin 81 MG chewable tablet Chew 81 mg by mouth daily.   atorvastatin 40 MG tablet Commonly known as: LIPITOR Take 40 mg by mouth daily.   cefadroxil 500 MG capsule Commonly known as: DURICEF Take 1 capsule (500 mg total) by mouth  2 (two) times daily for 11 days. Start taking on: February 12, 2022   glimepiride 2 MG tablet Commonly known as: AMARYL Take 2 mg by mouth daily.   metoprolol succinate 100 MG 24 hr tablet Commonly known as: TOPROL-XL Take 50 mg by mouth 2 (two) times daily.   timolol 0.25 % ophthalmic solution Commonly known as: TIMOPTIC Place 1 drop into the left eye in the morning.         Follow-up Information     Tracie Harrier, MD. Schedule an appointment as soon as possible for a visit in 1 week(s).   Specialty: Internal Medicine Why: hospital f/u Contact information: [address] [place] [address] [phone]                Discharge Exam: Nathaniel Nguyen Weights   02/08/22 1533  Weight: 76.2 kg     Condition at discharge: fair  The results of significant diagnostics from this hospitalization (including imaging, microbiology, ancillary and laboratory) are listed below for reference.   Imaging Studies: CT Abdomen Pelvis Wo Contrast  Result Date: 02/08/2022 CLINICAL DATA:  Abdominal pain, acute, nonlocalized diarrhea, leukocytosis EXAM: CT ABDOMEN AND PELVIS WITHOUT CONTRAST TECHNIQUE: Multidetector CT imaging of the abdomen and pelvis was performed following the standard protocol without IV contrast. RADIATION DOSE REDUCTION: This exam was performed according to the departmental dose-optimization program which includes automated exposure control, adjustment of the mA and/or kV according to patient size and/or use of iterative reconstruction technique. COMPARISON:  02/06/2008 FINDINGS: Lower chest: Bibasilar subsegmental atelectasis. Heart size within normal limits. Relative hypoattenuation of the cardiac blood pool indicative of anemia. Hepatobiliary: No focal liver abnormality is seen. Status post cholecystectomy. No biliary dilatation. Pancreas: Unremarkable. No pancreatic ductal dilatation or surrounding inflammatory changes. Spleen: Normal in size without focal abnormality. Adrenals/Urinary Tract: Unremarkable adrenal glands. Mild bilateral perinephric stranding, nonspecific. Mild bilateral hydronephrosis. No renal or ureteral calculi identified. No solid renal lesion identified. Circumferential wall thickening of the urinary bladder. Stomach/Bowel: Stomach is within normal limits. Appendix appears normal (series 2, image 53).  Scattered colonic diverticulosis. No evidence of bowel wall thickening, distention, or inflammatory changes. Vascular/Lymphatic: Scattered aortoiliac atherosclerotic calcifications without aneurysm. No abdominopelvic lymphadenopathy. Reproductive: Prostatomegaly. Other: No free fluid. No abdominopelvic fluid collection. No pneumoperitoneum. No abdominal wall hernia. Musculoskeletal: No acute or significant osseous findings. IMPRESSION: 1. Circumferential wall thickening of the urinary bladder, which may be related to chronic outlet obstruction or cystitis. Correlate with urinalysis. 2. Mild bilateral hydronephrosis with mild bilateral perinephric stranding. No renal or ureteral calculi identified. Findings could be related to outlet obstruction or ascending urinary tract infection. 3. Prostatomegaly. 4. Colonic diverticulosis without evidence of acute diverticulitis. 5. Aortic atherosclerosis (ICD10-I70.0). Electronically Signed   By: Davina Poke D.O.   On: 02/08/2022 16:20   DG Chest Port 1 View  Result Date: 02/08/2022 CLINICAL DATA:  Fever and chills. EXAM: PORTABLE CHEST 1 VIEW COMPARISON:  12/29/2020 and prior radiographs FINDINGS: The cardiomediastinal silhouette is unremarkable. There is no evidence of focal airspace disease, pulmonary edema, suspicious pulmonary nodule/mass, pleural effusion, or pneumothorax. No acute bony abnormalities are identified. IMPRESSION: No active disease. Electronically Signed   By: Margarette Canada M.D.   On: 02/08/2022 14:53    Microbiology: Results for orders placed or performed during the hospital encounter of 02/08/22  Urine Culture     Status: Abnormal   Collection Time: 02/08/22  1:22 PM   Specimen: In/Out Cath Urine  Result Value Ref Range  Status   Specimen Description   Final    IN/OUT CATH URINE Performed at [facility], [address] [place], [place] [address]    Special Requests   Final    NONE Performed at [facility], [address] [place], [place] [address]    Culture >=100,000 COLONIES/mL ESCHERICHIA COLI (A)  Final   Report Status 02/11/2022 FINAL  Final   Organism ID, Bacteria ESCHERICHIA COLI (A)  Final      Susceptibility   Escherichia coli - MIC*    AMPICILLIN >=32 RESISTANT Resistant     CEFAZOLIN <=4 SENSITIVE Sensitive     CEFEPIME <=0.12 SENSITIVE Sensitive     CEFTRIAXONE <=0.25 SENSITIVE Sensitive     CIPROFLOXACIN <=0.25 SENSITIVE Sensitive     GENTAMICIN <=1 SENSITIVE Sensitive     IMIPENEM <=0.25 SENSITIVE Sensitive     NITROFURANTOIN <=16 SENSITIVE Sensitive     TRIMETH/SULFA <=20 SENSITIVE Sensitive     AMPICILLIN/SULBACTAM >=32 RESISTANT Resistant     PIP/TAZO <=4 SENSITIVE Sensitive     * >=100,000 COLONIES/mL ESCHERICHIA COLI  Blood Culture (routine x 2)     Status: Abnormal (Preliminary result)   Collection Time: 02/08/22  2:10 PM   Specimen: BLOOD  Result Value Ref Range Status   Specimen Description   Final    BLOOD BLOOD LEFT HAND Performed at [facility], [address] [place], [place] [address]    Special Requests   Final    BOTTLES DRAWN AEROBIC AND ANAEROBIC Blood Culture results may not be optimal due to an excessive volume of blood received in culture bottles Performed at [facility], [address] [place], [place] [address]    Culture  Setup Time   Final    GRAM NEGATIVE RODS AEROBIC BOTTLE ONLY CRITICAL RESULT CALLED TO, READ BACK BY AND VERIFIED WITH: ALEX CHAPPELL '@0858'$  02/09/22 MJU    Culture (A)  Final    ESCHERICHIA COLI SUSCEPTIBILITIES TO FOLLOW REPEATING Performed at [facility], [address], [place], [place] [address]    Report Status PENDING  Incomplete  Blood Culture ID Panel (Reflexed)     Status: Abnormal   Collection Time: 02/08/22  2:10 PM  Result Value Ref Range Status   Enterococcus faecalis NOT DETECTED NOT DETECTED Final   Enterococcus Faecium NOT DETECTED NOT DETECTED Final   Listeria monocytogenes  NOT DETECTED NOT DETECTED Final   Staphylococcus species NOT DETECTED NOT DETECTED Final   Staphylococcus aureus (BCID) NOT DETECTED NOT DETECTED Final   Staphylococcus epidermidis NOT DETECTED NOT DETECTED Final   Staphylococcus lugdunensis NOT DETECTED NOT DETECTED Final   Streptococcus species NOT DETECTED NOT DETECTED Final   Streptococcus agalactiae NOT DETECTED NOT DETECTED Final   Streptococcus pneumoniae NOT DETECTED NOT DETECTED Final   Streptococcus pyogenes NOT DETECTED NOT DETECTED Final   A.calcoaceticus-baumannii NOT DETECTED NOT DETECTED Final   Bacteroides fragilis NOT DETECTED NOT DETECTED Final   Enterobacterales DETECTED (A) NOT DETECTED Final    Comment: Enterobacterales represent a large order of gram negative bacteria, not a single organism. CRITICAL RESULT CALLED TO, READ BACK BY AND VERIFIED WITH: ALEX CHAPPELL '@0858'$  02/09/22 MJU    Enterobacter cloacae complex NOT DETECTED NOT DETECTED Final   Escherichia coli DETECTED (A) NOT DETECTED Final    Comment: CRITICAL RESULT CALLED TO, READ BACK BY AND VERIFIED WITH: ALEX CHAPPELL '@0858'$  02/09/22 MJU    Klebsiella aerogenes NOT DETECTED NOT DETECTED Final   Klebsiella oxytoca  NOT DETECTED NOT DETECTED Final   Klebsiella pneumoniae NOT DETECTED NOT DETECTED Final   Proteus species NOT DETECTED NOT DETECTED Final   Salmonella species NOT DETECTED NOT DETECTED Final   Serratia marcescens NOT DETECTED NOT DETECTED Final   Haemophilus influenzae NOT DETECTED NOT DETECTED Final   Neisseria meningitidis NOT DETECTED NOT DETECTED Final   Pseudomonas aeruginosa NOT DETECTED NOT DETECTED Final   Stenotrophomonas maltophilia NOT DETECTED NOT DETECTED Final   Candida albicans NOT DETECTED NOT DETECTED Final   Candida auris NOT DETECTED NOT DETECTED Final   Candida glabrata NOT DETECTED NOT DETECTED Final   Candida krusei NOT DETECTED NOT DETECTED Final   Candida parapsilosis NOT DETECTED NOT DETECTED Final   Candida tropicalis  NOT DETECTED NOT DETECTED Final   Cryptococcus neoformans/gattii NOT DETECTED NOT DETECTED Final   CTX-M ESBL NOT DETECTED NOT DETECTED Final   Carbapenem resistance IMP NOT DETECTED NOT DETECTED Final   Carbapenem resistance KPC NOT DETECTED NOT DETECTED Final   Carbapenem resistance NDM NOT DETECTED NOT DETECTED Final   Carbapenem resist OXA 48 LIKE NOT DETECTED NOT DETECTED Final   Carbapenem resistance VIM NOT DETECTED NOT DETECTED Final    Comment: Performed at [facility], [address] [place], [place] [address]  Resp Panel by RT-PCR (Flu A&B, Covid) Anterior Nasal Swab     Status: None   Collection Time: 02/08/22  2:40 PM   Specimen: Anterior Nasal Swab  Result Value Ref Range Status   SARS Coronavirus 2 by RT PCR NEGATIVE NEGATIVE Final    Comment: (NOTE) SARS-CoV-2 target nucleic acids are NOT DETECTED.  The SARS-CoV-2 RNA is generally detectable in upper respiratory specimens during the acute phase of infection. The lowest concentration of SARS-CoV-2 viral copies this assay can detect is 138 copies/mL. A negative result does not preclude SARS-Cov-2 infection and should not be used as the sole basis for treatment or other patient management decisions. A negative result may occur with  improper specimen collection/handling, submission of specimen other than nasopharyngeal swab, presence of viral mutation(s) within the areas targeted by this assay, and inadequate number of viral copies(<138 copies/mL). A negative result must be combined with clinical observations, patient history, and epidemiological information. The expected result is Negative.  Fact Sheet for Patients:  EntrepreneurPulse.com.au  Fact Sheet for Healthcare Providers:  IncredibleEmployment.be  This test is no t yet approved or cleared by the [place] FDA and  has been authorized for detection and/or diagnosis of SARS-CoV-2 by FDA under an Emergency Use  Authorization (EUA). This EUA will remain  in effect (meaning this test can be used) for the duration of the COVID-19 declaration under Section 564(b)(1) of the Act, 21 U.S.C.section 360bbb-3(b)(1), unless the authorization is terminated  or revoked sooner.       Influenza A by PCR NEGATIVE NEGATIVE Final   Influenza B by PCR NEGATIVE NEGATIVE Final    Comment: (NOTE) The Xpert Xpress SARS-CoV-2/FLU/RSV plus assay is intended as an aid in the diagnosis of influenza from Nasopharyngeal swab specimens and should not be used as a sole basis for treatment. Nasal washings and aspirates are unacceptable for Xpert Xpress SARS-CoV-2/FLU/RSV testing.  Fact Sheet for Patients: EntrepreneurPulse.com.au  Fact Sheet for Healthcare Providers: IncredibleEmployment.be  This test is not yet approved or cleared by the [place] FDA and has been authorized for detection and/or diagnosis of SARS-CoV-2 by FDA under an Emergency Use Authorization (EUA). This EUA will remain in effect (meaning this test can be  used) for the duration of the COVID-19 declaration under Section 564(b)(1) of the Act, 21 U.S.C. section 360bbb-3(b)(1), unless the authorization is terminated or revoked.  Performed at [facility], [address] [place], [place] [address]   Blood Culture (routine x 2)     Status: None (Preliminary result)   Collection Time: 02/08/22  2:41 PM   Specimen: BLOOD  Result Value Ref Range Status   Specimen Description BLOOD RIGHT ANTECUBITAL  Final   Special Requests   Final    BOTTLES DRAWN AEROBIC AND ANAEROBIC Blood Culture adequate volume   Culture   Final    NO GROWTH 3 DAYS Performed at [facility], [address] [place], [place] [address]    Report Status PENDING  Incomplete  C Difficile Quick Screen w PCR reflex     Status: None   Collection Time: 02/08/22  3:22 PM   Specimen: STOOL  Result Value Ref Range Status   C  Diff antigen NEGATIVE NEGATIVE Final   C Diff toxin NEGATIVE NEGATIVE Final   C Diff interpretation No C. difficile detected.  Final    Comment: Performed at [facility], [address] [place], [place] [address]  Gastrointestinal Panel by PCR , Stool     Status: None   Collection Time: 02/08/22  3:22 PM   Specimen: Stool  Result Value Ref Range Status   Campylobacter species NOT DETECTED NOT DETECTED Final   Plesimonas shigelloides NOT DETECTED NOT DETECTED Final   Salmonella species NOT DETECTED NOT DETECTED Final   Yersinia enterocolitica NOT DETECTED NOT DETECTED Final   Vibrio species NOT DETECTED NOT DETECTED Final   Vibrio cholerae NOT DETECTED NOT DETECTED Final   Enteroaggregative E coli (EAEC) NOT DETECTED NOT DETECTED Final   Enteropathogenic E coli (EPEC) NOT DETECTED NOT DETECTED Final   Enterotoxigenic E coli (ETEC) NOT DETECTED NOT DETECTED Final   Shiga like toxin producing E coli (STEC) NOT DETECTED NOT DETECTED Final   Shigella/Enteroinvasive E coli (EIEC) NOT DETECTED NOT DETECTED Final   Cryptosporidium NOT DETECTED NOT DETECTED Final   Cyclospora cayetanensis NOT DETECTED NOT DETECTED Final   Entamoeba histolytica NOT DETECTED NOT DETECTED Final   Giardia lamblia NOT DETECTED NOT DETECTED Final   Adenovirus F40/41 NOT DETECTED NOT DETECTED Final   Astrovirus NOT DETECTED NOT DETECTED Final   Norovirus GI/GII NOT DETECTED NOT DETECTED Final   Rotavirus A NOT DETECTED NOT DETECTED Final   Sapovirus (I, II, IV, and V) NOT DETECTED NOT DETECTED Final    Comment: Performed at [facility], [address] [place], [place] [address]    Labs: CBC: Recent Labs  Lab 02/08/22 1322 02/11/22 0403  WBC 22.9* 15.0*  HGB 9.8* 8.5*  HCT 31.3* 26.5*  MCV 83.7 82.8  PLT 127* 742   Basic Metabolic Panel: Recent Labs  Lab 02/08/22 1322 02/08/22 2054 02/11/22 0403  NA 137 140 142  K 5.5* 4.5 4.6  CL 111 110 115*  CO2 20* 21* 25  GLUCOSE 67*  146* 144*  BUN 43* 42* 36*  CREATININE 3.73* 3.11* 2.49*  CALCIUM 8.3* 7.8* 8.5*   Liver Function Tests: Recent Labs  Lab 02/08/22 2054  AST 31  ALT 19  ALKPHOS 67  BILITOT 1.2  PROT 5.9*  ALBUMIN 3.0*   CBG: Recent Labs  Lab 02/10/22 0916 02/10/22 1124 02/10/22 1654 02/10/22 2050 02/11/22 0754  GLUCAP 85 127* 183* 171* 118*    Discharge time spent: greater than  30 minutes.  Signed: Fritzi Mandes, MD [facility] 02/11/2022

## 2022-02-11 NOTE — Plan of Care (Signed)
Problem: Fluid Volume: Goal: Hemodynamic stability will improve 02/11/2022 1008 by Gwendel Hanson, LPN Outcome: Adequate for Discharge 02/11/2022 1007 by Gwendel Hanson, LPN Outcome: Progressing   Problem: Clinical Measurements: Goal: Diagnostic test results will improve 02/11/2022 1008 by Gwendel Hanson, LPN Outcome: Adequate for Discharge 02/11/2022 1007 by Gwendel Hanson, LPN Outcome: Progressing Goal: Signs and symptoms of infection will decrease 02/11/2022 1008 by Gwendel Hanson, LPN Outcome: Adequate for Discharge 02/11/2022 1007 by Gwendel Hanson, LPN Outcome: Progressing   Problem: Respiratory: Goal: Ability to maintain adequate ventilation will improve 02/11/2022 1008 by Gwendel Hanson, LPN Outcome: Adequate for Discharge 02/11/2022 1007 by Gwendel Hanson, LPN Outcome: Progressing   Problem: Education: Goal: Ability to describe self-care measures that may prevent or decrease complications (Diabetes Survival Skills Education) will improve 02/11/2022 1008 by Gwendel Hanson, LPN Outcome: Adequate for Discharge 02/11/2022 1007 by Gwendel Hanson, LPN Outcome: Progressing Goal: Individualized Educational Video(s) 02/11/2022 1008 by Gwendel Hanson, LPN Outcome: Adequate for Discharge 02/11/2022 1007 by Gwendel Hanson, LPN Outcome: Progressing   Problem: Coping: Goal: Ability to adjust to condition or change in health will improve 02/11/2022 1008 by Gwendel Hanson, LPN Outcome: Adequate for Discharge 02/11/2022 1007 by Gwendel Hanson, LPN Outcome: Progressing   Problem: Fluid Volume: Goal: Ability to maintain a balanced intake and output will improve 02/11/2022 1008 by Gwendel Hanson, LPN Outcome: Adequate for Discharge 02/11/2022 1007 by Gwendel Hanson, LPN Outcome: Progressing   Problem: Health Behavior/Discharge Planning: Goal: Ability to identify and utilize available resources and services will improve 02/11/2022 1008 by Gwendel Hanson, LPN Outcome: Adequate for  Discharge 02/11/2022 1007 by Gwendel Hanson, LPN Outcome: Progressing Goal: Ability to manage health-related needs will improve 02/11/2022 1008 by Gwendel Hanson, LPN Outcome: Adequate for Discharge 02/11/2022 1007 by Gwendel Hanson, LPN Outcome: Progressing   Problem: Metabolic: Goal: Ability to maintain appropriate glucose levels will improve 02/11/2022 1008 by Gwendel Hanson, LPN Outcome: Adequate for Discharge 02/11/2022 1007 by Gwendel Hanson, LPN Outcome: Progressing   Problem: Nutritional: Goal: Maintenance of adequate nutrition will improve 02/11/2022 1008 by Gwendel Hanson, LPN Outcome: Adequate for Discharge 02/11/2022 1007 by Gwendel Hanson, LPN Outcome: Progressing Goal: Progress toward achieving an optimal weight will improve 02/11/2022 1008 by Gwendel Hanson, LPN Outcome: Adequate for Discharge 02/11/2022 1007 by Gwendel Hanson, LPN Outcome: Progressing   Problem: Skin Integrity: Goal: Risk for impaired skin integrity will decrease 02/11/2022 1008 by Gwendel Hanson, LPN Outcome: Adequate for Discharge 02/11/2022 1007 by Gwendel Hanson, LPN Outcome: Progressing   Problem: Tissue Perfusion: Goal: Adequacy of tissue perfusion will improve 02/11/2022 1008 by Gwendel Hanson, LPN Outcome: Adequate for Discharge 02/11/2022 1007 by Gwendel Hanson, LPN Outcome: Progressing   Problem: Education: Goal: Knowledge of General Education information will improve Description: Including pain rating scale, medication(s)/side effects and non-pharmacologic comfort measures 02/11/2022 1008 by Gwendel Hanson, LPN Outcome: Adequate for Discharge 02/11/2022 1007 by Gwendel Hanson, LPN Outcome: Progressing   Problem: Health Behavior/Discharge Planning: Goal: Ability to manage health-related needs will improve 02/11/2022 1008 by Gwendel Hanson, LPN Outcome: Adequate for Discharge 02/11/2022 1007 by Gwendel Hanson, LPN Outcome: Progressing   Problem: Clinical Measurements: Goal: Ability to  maintain clinical measurements within normal limits will improve 02/11/2022 1008 by Gwendel Hanson, LPN Outcome: Adequate for Discharge 02/11/2022 1007 by Gwendel Hanson, LPN Outcome: Progressing Goal: Will remain free  from infection 02/11/2022 1008 by Gwendel Hanson, LPN Outcome: Adequate for Discharge 02/11/2022 1007 by Gwendel Hanson, LPN Outcome: Progressing Goal: Diagnostic test results will improve 02/11/2022 1008 by Gwendel Hanson, LPN Outcome: Adequate for Discharge 02/11/2022 1007 by Gwendel Hanson, LPN Outcome: Progressing Goal: Respiratory complications will improve 02/11/2022 1008 by Gwendel Hanson, LPN Outcome: Adequate for Discharge 02/11/2022 1007 by Gwendel Hanson, LPN Outcome: Progressing Goal: Cardiovascular complication will be avoided 02/11/2022 1008 by Gwendel Hanson, LPN Outcome: Adequate for Discharge 02/11/2022 1007 by Gwendel Hanson, LPN Outcome: Progressing   Problem: Activity: Goal: Risk for activity intolerance will decrease 02/11/2022 1008 by Gwendel Hanson, LPN Outcome: Adequate for Discharge 02/11/2022 1007 by Gwendel Hanson, LPN Outcome: Progressing   Problem: Nutrition: Goal: Adequate nutrition will be maintained 02/11/2022 1008 by Gwendel Hanson, LPN Outcome: Adequate for Discharge 02/11/2022 1007 by Gwendel Hanson, LPN Outcome: Progressing   Problem: Coping: Goal: Level of anxiety will decrease 02/11/2022 1008 by Gwendel Hanson, LPN Outcome: Adequate for Discharge 02/11/2022 1007 by Gwendel Hanson, LPN Outcome: Progressing   Problem: Elimination: Goal: Will not experience complications related to bowel motility 02/11/2022 1008 by Gwendel Hanson, LPN Outcome: Adequate for Discharge 02/11/2022 1007 by Gwendel Hanson, LPN Outcome: Progressing Goal: Will not experience complications related to urinary retention 02/11/2022 1008 by Gwendel Hanson, LPN Outcome: Adequate for Discharge 02/11/2022 1007 by Gwendel Hanson, LPN Outcome: Progressing    Problem: Pain Managment: Goal: General experience of comfort will improve 02/11/2022 1008 by Gwendel Hanson, LPN Outcome: Adequate for Discharge 02/11/2022 1007 by Gwendel Hanson, LPN Outcome: Progressing   Problem: Safety: Goal: Ability to remain free from injury will improve 02/11/2022 1008 by Gwendel Hanson, LPN Outcome: Adequate for Discharge 02/11/2022 1007 by Gwendel Hanson, LPN Outcome: Progressing   Problem: Skin Integrity: Goal: Risk for impaired skin integrity will decrease 02/11/2022 1008 by Gwendel Hanson, LPN Outcome: Adequate for Discharge 02/11/2022 1007 by Gwendel Hanson, LPN Outcome: Progressing

## 2022-02-11 NOTE — Plan of Care (Signed)

## 2022-02-11 NOTE — Progress Notes (Signed)
Patient received discharge instructions. Discharge to home, transport via private vehicle with daughter, Levada Dy.

## 2022-02-11 NOTE — TOC Progression Note (Signed)
Transition of Care [facility]) - Progression Note    Patient Details  Name: Nathaniel Nguyen MRN: [id number] Date of Birth: 12-Dec-1945  Transition of Care [facility] CM/SW [place], RN Phone Number: 02/11/2022, 10:05 AM  Clinical Narrative:      Transition of Care (TOC) Screening Note   Patient Details  Name: Nathaniel Nguyen Date of Birth: [birth year]   Transition of Care [facility]) CM/SW Contact:    Conception Oms, RN Phone Number: 02/11/2022, 10:05 AM    Transition of Care Department [facility]) has reviewed patient and no TOC needs have been identified at this time. We will continue to monitor patient advancement through interdisciplinary progression rounds. If new patient transition needs arise, please place a TOC consult.         Expected Discharge Plan and Services           Expected Discharge Date: 02/11/22                                     Social Determinants of Health (SDOH) Interventions    Readmission Risk Interventions     No data to display

## 2022-02-12 LAB — CULTURE, BLOOD (ROUTINE X 2)

## 2022-02-13 LAB — CULTURE, BLOOD (ROUTINE X 2)
Culture: NO GROWTH
Special Requests: ADEQUATE

## 2022-04-17 IMAGING — CR DG CHEST 2V
1 series · 2 of 2 positions shown · non-contrast
Comparison: March 01, 2013

CLINICAL DATA: Cough.  History of cardiomyopathy

EXAM:
CHEST - 2 VIEW

[Series 1: dg chest 2 view · 0.14mm/px · 2 of 2 slices shown]
[im 1/2]
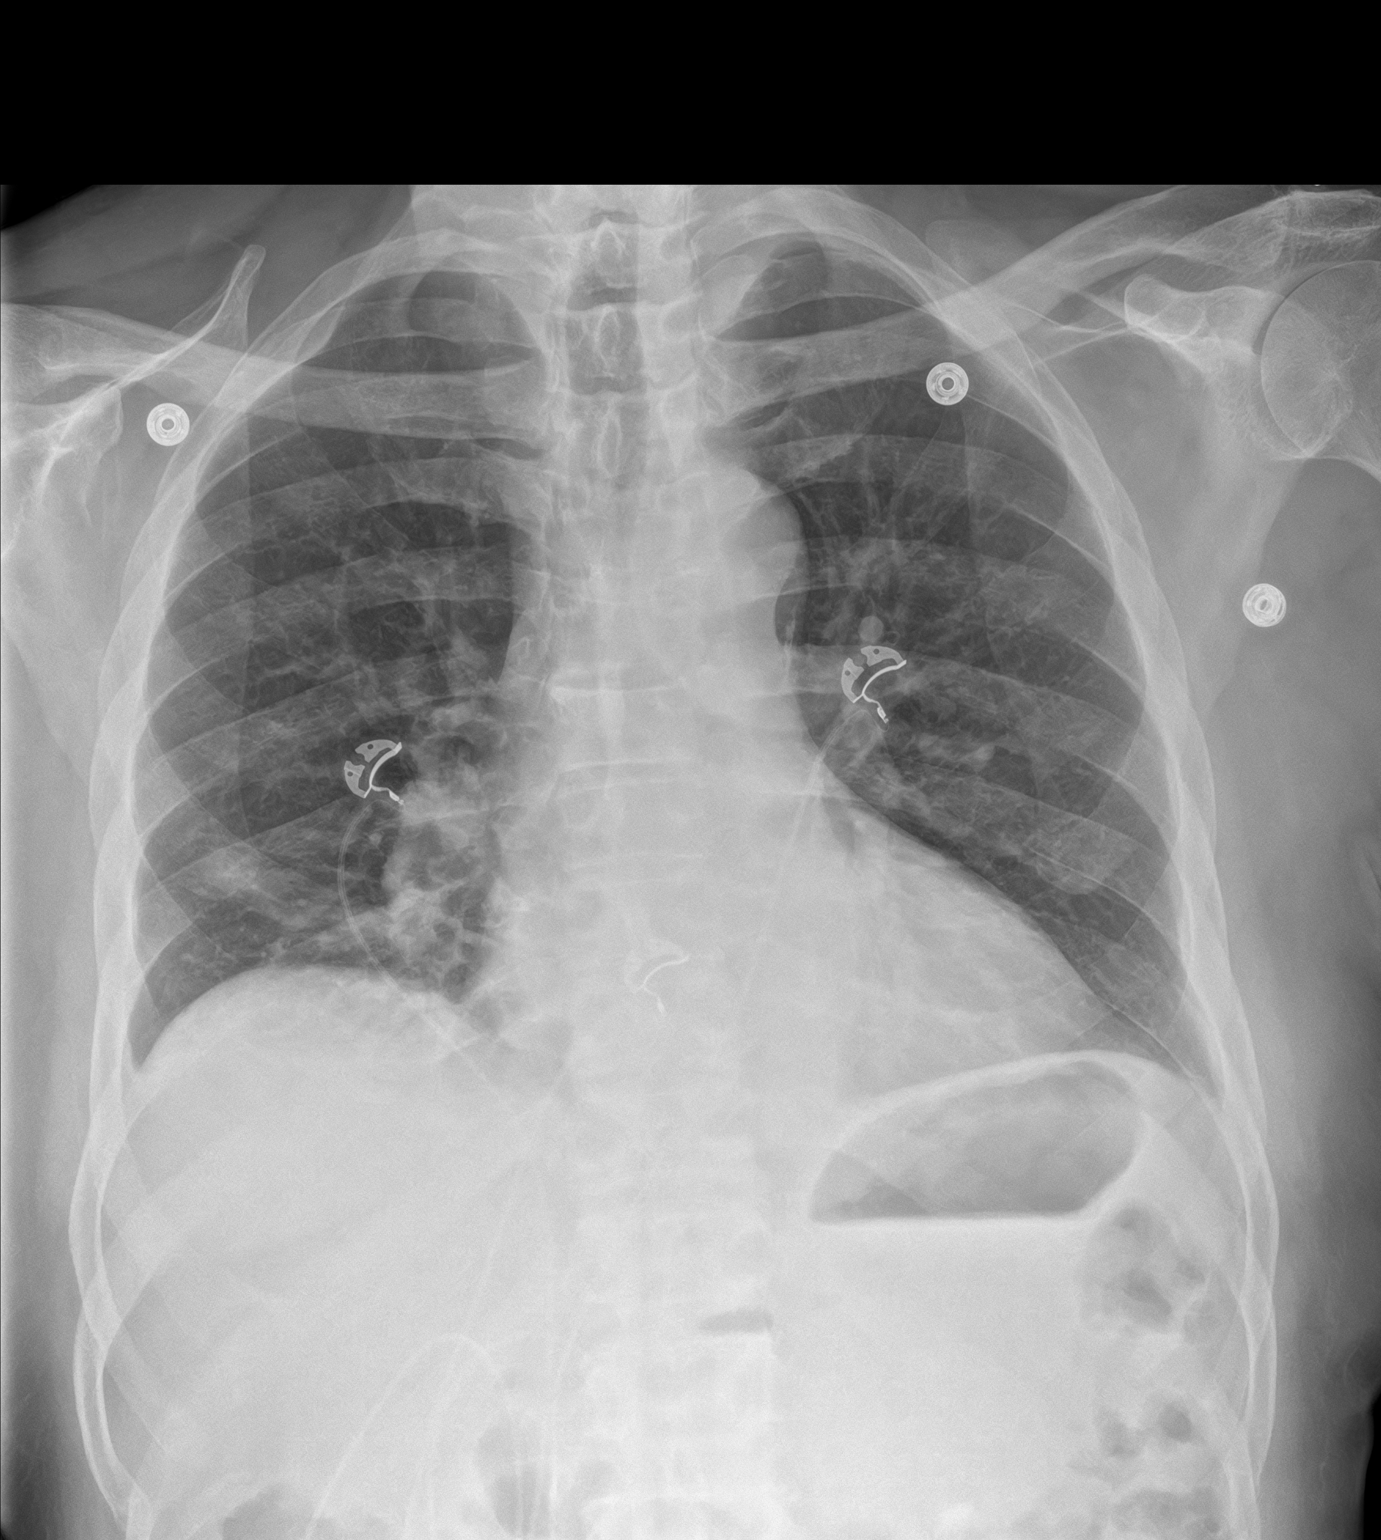
[im 2/2]
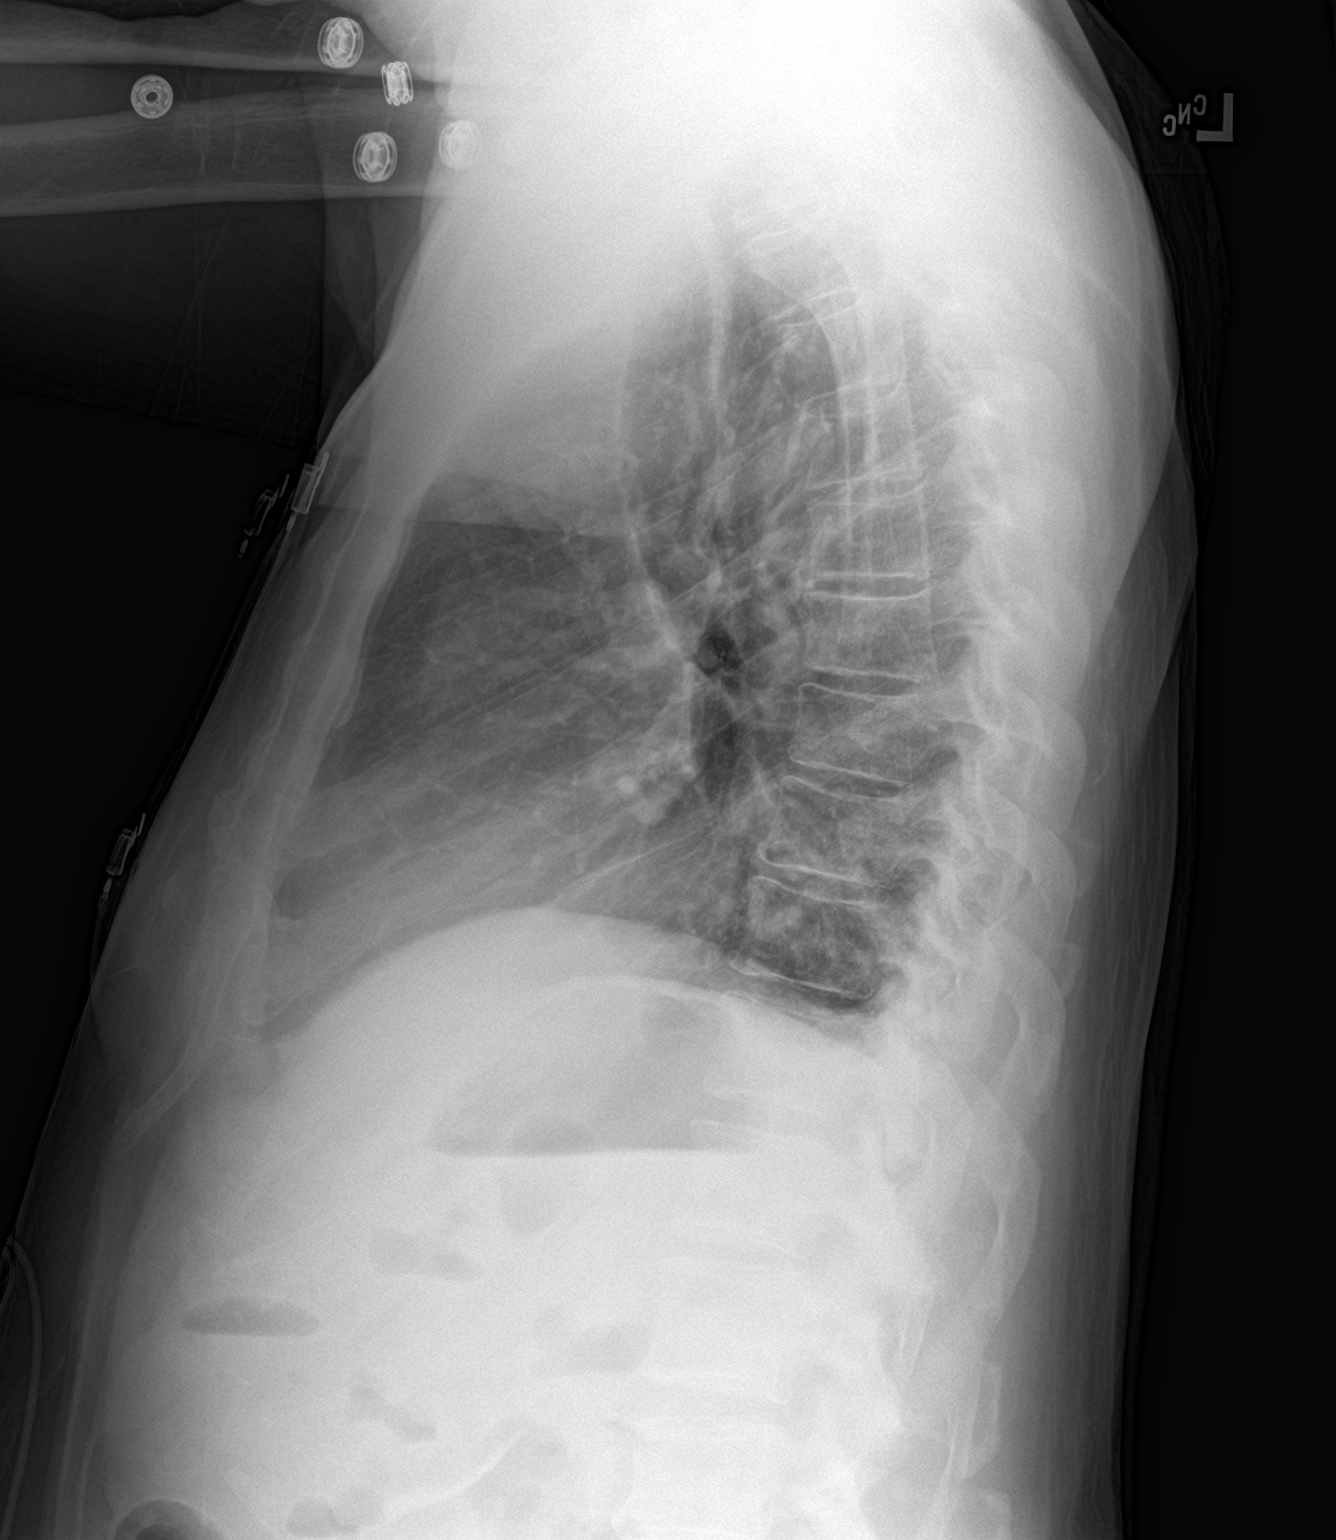

[2 of 2 positions shown; findings below may reference images not displayed]

FINDINGS: There is slight scarring in the right base. No edema or airspace
opacity. Heart is borderline prominent with pulmonary vascularity
normal. No adenopathy. No bone lesions.
IMPRESSION: Slight scarring right base. No edema or airspace opacity. Borderline
cardiac prominence.

## 2022-12-16 ENCOUNTER — Encounter: Payer: Self-pay | Admitting: *Deleted

## 2022-12-16 ENCOUNTER — Ambulatory Visit: Payer: Medicare PPO | Admitting: Anesthesiology

## 2022-12-16 ENCOUNTER — Encounter
Admission: RE | Disposition: A | Discharge status: Home / Self Care | Payer: Self-pay | Source: Home / Self Care | Attending: Gastroenterology

## 2022-12-16 ENCOUNTER — Ambulatory Visit
Admission: RE | Admit: 2022-12-16 | Discharge: 2022-12-16 | Disposition: A | Discharge status: Home / Self Care | Payer: Medicare PPO | Attending: Gastroenterology | Admitting: Gastroenterology

## 2022-12-16 DIAGNOSIS — I252 Old myocardial infarction: Secondary | ICD-10-CM | POA: Insufficient documentation

## 2022-12-16 DIAGNOSIS — K5730 Diverticulosis of large intestine without perforation or abscess without bleeding: Secondary | ICD-10-CM | POA: Insufficient documentation

## 2022-12-16 DIAGNOSIS — K635 Polyp of colon: Secondary | ICD-10-CM | POA: Diagnosis not present

## 2022-12-16 DIAGNOSIS — K640 First degree hemorrhoids: Secondary | ICD-10-CM | POA: Insufficient documentation

## 2022-12-16 DIAGNOSIS — I429 Cardiomyopathy, unspecified: Secondary | ICD-10-CM | POA: Diagnosis not present

## 2022-12-16 DIAGNOSIS — E1122 Type 2 diabetes mellitus with diabetic chronic kidney disease: Secondary | ICD-10-CM | POA: Diagnosis not present

## 2022-12-16 DIAGNOSIS — N184 Chronic kidney disease, stage 4 (severe): Secondary | ICD-10-CM | POA: Insufficient documentation

## 2022-12-16 DIAGNOSIS — K2289 Other specified disease of esophagus: Secondary | ICD-10-CM | POA: Diagnosis not present

## 2022-12-16 DIAGNOSIS — K449 Diaphragmatic hernia without obstruction or gangrene: Secondary | ICD-10-CM | POA: Insufficient documentation

## 2022-12-16 DIAGNOSIS — B9681 Helicobacter pylori [H. pylori] as the cause of diseases classified elsewhere: Secondary | ICD-10-CM | POA: Diagnosis not present

## 2022-12-16 DIAGNOSIS — K2950 Unspecified chronic gastritis without bleeding: Secondary | ICD-10-CM | POA: Insufficient documentation

## 2022-12-16 DIAGNOSIS — I2510 Atherosclerotic heart disease of native coronary artery without angina pectoris: Secondary | ICD-10-CM | POA: Diagnosis not present

## 2022-12-16 DIAGNOSIS — D509 Iron deficiency anemia, unspecified: Secondary | ICD-10-CM | POA: Diagnosis present

## 2022-12-16 DIAGNOSIS — Z9049 Acquired absence of other specified parts of digestive tract: Secondary | ICD-10-CM | POA: Diagnosis not present

## 2022-12-16 DIAGNOSIS — Z7984 Long term (current) use of oral hypoglycemic drugs: Secondary | ICD-10-CM | POA: Insufficient documentation

## 2022-12-16 DIAGNOSIS — I10 Essential (primary) hypertension: Secondary | ICD-10-CM | POA: Diagnosis not present

## 2022-12-16 DIAGNOSIS — E119 Type 2 diabetes mellitus without complications: Secondary | ICD-10-CM | POA: Diagnosis not present

## 2022-12-16 DIAGNOSIS — H409 Unspecified glaucoma: Secondary | ICD-10-CM | POA: Diagnosis not present

## 2022-12-16 DIAGNOSIS — Z87891 Personal history of nicotine dependence: Secondary | ICD-10-CM | POA: Insufficient documentation

## 2022-12-16 DIAGNOSIS — E7800 Pure hypercholesterolemia, unspecified: Secondary | ICD-10-CM | POA: Diagnosis not present

## 2022-12-16 DIAGNOSIS — I129 Hypertensive chronic kidney disease with stage 1 through stage 4 chronic kidney disease, or unspecified chronic kidney disease: Secondary | ICD-10-CM | POA: Insufficient documentation

## 2022-12-16 HISTORY — PX: ESOPHAGOGASTRODUODENOSCOPY (EGD) WITH PROPOFOL: SHX5813

## 2022-12-16 HISTORY — PX: COLONOSCOPY WITH PROPOFOL: SHX5780

## 2022-12-16 LAB — GLUCOSE, CAPILLARY
Glucose-Capillary: 57 mg/dL — ABNORMAL LOW (ref 70–99)
Glucose-Capillary: 93 mg/dL (ref 70–99)

## 2022-12-16 SURGERY — COLONOSCOPY WITH PROPOFOL
Anesthesia: General

## 2022-12-16 MED ORDER — PROPOFOL 10 MG/ML IV BOLUS
INTRAVENOUS | Status: DC | PRN
  Administered 2022-12-16: 130 ug/kg/min via INTRAVENOUS
  Administered 2022-12-16: 90 mg via INTRAVENOUS

## 2022-12-16 MED ORDER — LIDOCAINE HCL (PF) 2 % IJ SOLN
INTRAMUSCULAR | Status: AC
  Filled 2022-12-16: qty 5

## 2022-12-16 MED ORDER — GLUCAGON HCL RDNA (DIAGNOSTIC) 1 MG IJ SOLR
INTRAMUSCULAR | Status: AC
  Filled 2022-12-16: qty 1

## 2022-12-16 MED ORDER — PHENYLEPHRINE HCL (PRESSORS) 10 MG/ML IV SOLN
INTRAVENOUS | Status: DC | PRN
  Administered 2022-12-16 (×3): 80 ug via INTRAVENOUS

## 2022-12-16 MED ORDER — DEXTROSE 50 % IV SOLN
12.5000 g | Freq: Once | INTRAVENOUS | Status: AC
  Administered 2022-12-16: 12.5 g via INTRAVENOUS

## 2022-12-16 MED ORDER — PROPOFOL 10 MG/ML IV BOLUS
INTRAVENOUS | Status: AC
  Filled 2022-12-16: qty 40

## 2022-12-16 MED ORDER — LIDOCAINE HCL (CARDIAC) PF 100 MG/5ML IV SOSY
PREFILLED_SYRINGE | INTRAVENOUS | Status: DC | PRN
  Administered 2022-12-16: 100 mg via INTRAVENOUS

## 2022-12-16 MED ORDER — DEXTROSE 50 % IV SOLN
INTRAVENOUS | Status: AC
  Filled 2022-12-16: qty 50

## 2022-12-16 MED ORDER — SODIUM CHLORIDE 0.9 % IV SOLN: INTRAVENOUS | Status: DC

## 2022-12-16 NOTE — Interval H&P Note (Signed)
History and Physical Interval Note:  12/16/2022 10:19 AM  Nathaniel Nguyen  has presented today for surgery, with the diagnosis of V76.51 (ICD-9-CM) - Z12.11 (ICD-10-CM) - Colon cancer screening 280.9 (ICD-9-CM) - D50.9 (ICD-10-CM) - Iron deficiency anemia, unspecified iron deficiency anemia type.  The various methods of treatment have been discussed with the patient and family. After consideration of risks, benefits and other options for treatment, the patient has consented to  Procedure(s): COLONOSCOPY WITH PROPOFOL (N/A) ESOPHAGOGASTRODUODENOSCOPY (EGD) WITH PROPOFOL (N/A) as a surgical intervention.  The patient's history has been reviewed, patient examined, no change in status, stable for surgery.  I have reviewed the patient's chart and labs.  Questions were answered to the patient's satisfaction.     Regis Bill  Ok to proceed with EGD/Colonoscopy

## 2022-12-16 NOTE — Anesthesia Preprocedure Evaluation (Addendum)
Anesthesia Evaluation  Patient identified by MRN, date of birth, ID band Patient awake    Reviewed: Allergy & Precautions, NPO status , Patient's Chart, lab work & pertinent test results  History of Anesthesia Complications Negative for: history of anesthetic complications  Airway Mallampati: III  TM Distance: <3 FB Neck ROM: full    Dental  (+) Chipped, Poor Dentition   Pulmonary neg shortness of breath, former smoker   Pulmonary exam normal        Cardiovascular Exercise Tolerance: Good hypertension, (-) angina + CAD and + Past MI  Normal cardiovascular exam+ dysrhythmias      Neuro/Psych negative neurological ROS  negative psych ROS   GI/Hepatic negative GI ROS, Neg liver ROS,neg GERD  ,,  Endo/Other  negative endocrine ROSdiabetes, Type 2    Renal/GU   negative genitourinary   Musculoskeletal   Abdominal   Peds  Hematology negative hematology ROS (+)   Anesthesia Other Findings Past Medical History: No date: Cardiomyopathy (HCC) No date: Chronic kidney disease     Comment:  chronic, stage III No date: Coronary artery disease No date: Diabetes mellitus without complication (HCC) No date: Dysrhythmia     Comment:  hx V-Tach No date: Glaucoma No date: Hypercholesterolemia No date: Hypertension No date: 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 02/08/2022: Severe sepsis [facility])  Past Surgical History: 06/08/2017: COLONOSCOPY WITH PROPOFOL; N/A     Comment:  Procedure: COLONOSCOPY WITH PROPOFOL;  Surgeon:               Christena Deem, MD;  Location: [facility] ENDOSCOPY;                Service: Endoscopy;  Laterality: N/A; 08/25/2014: EYE SURGERY; Left     Comment:  cataract 02/22/2014: EYE SURGERY; Right     Comment:  lens 02/14/2014: EYE SURGERY; Right     Comment:  Vitreous Retinal Surgery 02/26/2016: EYE SURGERY; Left     Comment:  Vitreous Retinal Surgery 2014: V TACH  ABLATION     Reproductive/Obstetrics negative OB ROS                             Anesthesia Physical Anesthesia Plan  ASA: 3  Anesthesia Plan: General   Post-op Pain Management:    Induction: Intravenous  PONV Risk Score and Plan: Propofol infusion and TIVA  Airway Management Planned: Natural Airway and Nasal Cannula  Additional Equipment:   Intra-op Plan:   Post-operative Plan:   Informed Consent: I have reviewed the patients History and Physical, chart, labs and discussed the procedure including the risks, benefits and alternatives for the proposed anesthesia with the patient or authorized representative who has indicated his/her understanding and acceptance.     Dental Advisory Given  Plan Discussed with: Anesthesiologist, CRNA and Surgeon  Anesthesia Plan Comments: (Patient consented for risks of anesthesia including but not limited to:  - adverse reactions to medications - risk of airway placement if required - damage to eyes, teeth, lips or other oral mucosa - nerve damage due to positioning  - sore throat or hoarseness - Damage to heart, brain, nerves, lungs, other parts of body or loss of life  Patient voiced understanding.)       Anesthesia Quick Evaluation

## 2022-12-16 NOTE — Op Note (Signed)
[facility] Patient Name: Nathaniel Nguyen Procedure Date: 12/16/2022 10:19 AM MRN: [id number] Account #: [id number] Date of Birth: January 30, 1946 Admit Type: Outpatient Age: 77 Room: [facility] ENDO ROOM 1 Gender: Male Note Status: Supervisor Override Instrument Name: Prentice Docker [id number] Procedure:             Colonoscopy Indications:           Iron deficiency anemia Providers:             Eather Colas MD, MD Referring MD:          Duane Lope. Judithann Sheen, MD (Referring MD) Medicines:             Monitored Anesthesia Care Complications:         No immediate complications. Estimated blood loss:                         Minimal. Procedure:             Pre-Anesthesia Assessment:                        - Prior to the procedure, a History and Physical was                         performed, and patient medications and allergies were                         reviewed. The patient is competent. The risks and                         benefits of the procedure and the sedation options and                         risks were discussed with the patient. All questions                         were answered and informed consent was obtained.                         Patient identification and proposed procedure were                         verified by the physician, the nurse, the                         anesthesiologist, the anesthetist and the technician                         in the endoscopy suite. Mental Status Examination:                         alert and oriented. Airway Examination: normal                         oropharyngeal airway and neck mobility. Respiratory                         Examination: clear to auscultation. CV Examination:  normal. Prophylactic Antibiotics: The patient does not                         require prophylactic antibiotics. Prior                         Anticoagulants: The patient has taken no anticoagulant                          or antiplatelet agents. ASA Grade Assessment: III - A                         patient with severe systemic disease. After reviewing                         the risks and benefits, the patient was deemed in                         satisfactory condition to undergo the procedure. The                         anesthesia plan was to use monitored anesthesia care                         (MAC). Immediately prior to administration of                         medications, the patient was re-assessed for adequacy                         to receive sedatives. The heart rate, respiratory                         rate, oxygen saturations, blood pressure, adequacy of                         pulmonary ventilation, and response to care were                         monitored throughout the procedure. The physical                         status of the patient was re-assessed after the                         procedure.                        After obtaining informed consent, the colonoscope was                         passed under direct vision. Throughout the procedure,                         the patient's blood pressure, pulse, and oxygen                         saturations were monitored continuously. The  Colonoscope was introduced through the anus and                         advanced to the the cecum, identified by appendiceal                         orifice and ileocecal valve. The colonoscopy was                         somewhat difficult due to significant looping.                         Successful completion of the procedure was aided by                         applying abdominal pressure. The patient tolerated the                         procedure well. The quality of the bowel preparation                         was adequate to identify polyps. The ileocecal valve,                         appendiceal orifice, and rectum were photographed. Findings:      The  perianal and digital rectal examinations were normal.      A 3 mm polyp was found in the cecum. The polyp was sessile. The polyp       was removed with a cold snare. Resection and retrieval were complete.       Estimated blood loss was minimal.      Scattered large-mouthed and small-mouthed diverticula were found in the       sigmoid colon, descending colon, transverse colon and ascending colon.      Internal hemorrhoids were found during retroflexion. The hemorrhoids       were Grade I (internal hemorrhoids that do not prolapse).      The exam was otherwise without abnormality on direct and retroflexion       views. Impression:            - One 3 mm polyp in the cecum, removed with a cold                         snare. Resected and retrieved.                        - Diverticulosis in the sigmoid colon, in the                         descending colon, in the transverse colon and in the                         ascending colon.                        - Internal hemorrhoids.                        - The examination was otherwise normal on direct and  retroflexion views. Recommendation:        - Discharge patient to home.                        - Resume previous diet.                        - Continue present medications.                        - Await pathology results.                        - Repeat colonoscopy for surveillance based on                         pathology results.                        - Return to referring physician as previously                         scheduled. Procedure Code(s):     --- Professional ---                        [phone], Colonoscopy, flexible; with removal of                         tumor(s), polyp(s), or other lesion(s) by snare                         technique Diagnosis Code(s):     --- Professional ---                        Z12.11, Encounter for screening for malignant neoplasm                         of colon                         D12.0, Benign neoplasm of cecum                        K64.0, First degree hemorrhoids                        K57.30, Diverticulosis of large intestine without                         perforation or abscess without bleeding CPT copyright 2022 [employer]. All rights reserved. The codes documented in this report are preliminary and upon coder review may  be revised to meet current compliance requirements. Eather Colas MD, MD 12/16/2022 11:16:45 AM Number of Addenda: 0 Note Initiated On: 12/16/2022 10:19 AM Scope Withdrawal Time: 0 hours 8 minutes 50 seconds  Total Procedure Duration: 0 hours 14 minutes 26 seconds  Estimated Blood Loss:  Estimated blood loss was minimal.      [facility]

## 2022-12-16 NOTE — Op Note (Signed)
[facility] Gastroenterology Patient Name: Nathaniel Nguyen Procedure Date: 12/16/2022 10:19 AM MRN: [id number] Account #: [id number] Date of Birth: [birth year] Admit Type: Outpatient Age: 77 Room: [facility] ENDO ROOM 1 Gender: Male Note Status: Finalized Instrument Name: Upper Endoscope [id number] Procedure:             Upper GI endoscopy Indications:           Iron deficiency anemia Providers:             Eather Colas MD, MD Referring MD:          Duane Lope. Judithann Sheen, MD (Referring MD) Medicines:             Monitored Anesthesia Care Complications:         No immediate complications. Estimated blood loss:                         Minimal. Procedure:             Pre-Anesthesia Assessment:                        - Prior to the procedure, a History and Physical was                         performed, and patient medications and allergies were                         reviewed. The patient is competent. The risks and                         benefits of the procedure and the sedation options and                         risks were discussed with the patient. All questions                         were answered and informed consent was obtained.                         Patient identification and proposed procedure were                         verified by the physician, the nurse, the                         anesthesiologist, the anesthetist and the technician                         in the endoscopy suite. Mental Status Examination:                         alert and oriented. Airway Examination: normal                         oropharyngeal airway and neck mobility. Respiratory                         Examination: clear to auscultation. CV Examination:  normal. Prophylactic Antibiotics: The patient does not                         require prophylactic antibiotics. Prior                         Anticoagulants: The patient has taken no anticoagulant                          or antiplatelet agents. ASA Grade Assessment: III - A                         patient with severe systemic disease. After reviewing                         the risks and benefits, the patient was deemed in                         satisfactory condition to undergo the procedure. The                         anesthesia plan was to use monitored anesthesia care                         (MAC). Immediately prior to administration of                         medications, the patient was re-assessed for adequacy                         to receive sedatives. The heart rate, respiratory                         rate, oxygen saturations, blood pressure, adequacy of                         pulmonary ventilation, and response to care were                         monitored throughout the procedure. The physical                         status of the patient was re-assessed after the                         procedure.                        After obtaining informed consent, the endoscope was                         passed under direct vision. Throughout the procedure,                         the patient's blood pressure, pulse, and oxygen                         saturations were monitored continuously. The Endoscope  was introduced through the mouth, and advanced to the                         second part of duodenum. The upper GI endoscopy was                         accomplished without difficulty. The patient tolerated                         the procedure well. Findings:      Islands of salmon-colored mucosa were present. No other visible       abnormalities were present. The maximum longitudinal extent of these       esophageal mucosal changes was 1 cm in length. Biopsies were taken with       a cold forceps for histology. Estimated blood loss was minimal.      A small hiatal hernia was present.      Patchy mild inflammation characterized by erythema was found in the        gastric antrum. Biopsies were taken with a cold forceps for Helicobacter       pylori testing. Estimated blood loss was minimal.      The examined duodenum was normal. Impression:            - Salmon-colored mucosa. Biopsied.                        - Small hiatal hernia.                        - Gastritis. Biopsied.                        - Normal examined duodenum. Recommendation:        - Discharge patient to home.                        - Resume previous diet.                        - Continue present medications.                        - Await pathology results.                        - Return to referring physician as previously                         scheduled. Procedure Code(s):     --- Professional ---                        [phone], Esophagogastroduodenoscopy, flexible,                         transoral; with biopsy, single or multiple Diagnosis Code(s):     --- Professional ---                        K22.89, Other specified disease of esophagus  K44.9, Diaphragmatic hernia without obstruction or                         gangrene                        K29.70, Gastritis, unspecified, without bleeding                        D50.9, Iron deficiency anemia, unspecified CPT copyright 2022 American Medical Association. All rights reserved. The codes documented in this report are preliminary and upon coder review may  be revised to meet current compliance requirements. Eather Colas MD, MD 12/16/2022 11:14:03 AM Number of Addenda: 0 Note Initiated On: 12/16/2022 10:19 AM Estimated Blood Loss:  Estimated blood loss was minimal.      [facility]

## 2022-12-16 NOTE — Anesthesia Postprocedure Evaluation (Signed)
Anesthesia Post Note  Patient: Lyndale Maragh  Procedure(s) Performed: COLONOSCOPY WITH PROPOFOL ESOPHAGOGASTRODUODENOSCOPY (EGD) WITH PROPOFOL  Patient location during evaluation: Endoscopy Anesthesia Type: General Level of consciousness: awake and alert Pain management: pain level controlled Vital Signs Assessment: post-procedure vital signs reviewed and stable Respiratory status: spontaneous breathing, nonlabored ventilation, respiratory function stable and patient connected to nasal cannula oxygen Cardiovascular status: blood pressure returned to baseline and stable Postop Assessment: no apparent nausea or vomiting Anesthetic complications: no   No notable events documented.   Last Vitals:  Vitals:   12/16/22 1115 12/16/22 1130  BP: (!) 101/45 (!) 97/48  Pulse: (!) 55 (!) 54  Resp: 15 14  Temp:    SpO2: 99% 98%    Last Pain:  Vitals:   12/16/22 1115  TempSrc:   PainSc: 0-No pain                 Cleda Mccreedy Zoe Nordin

## 2022-12-16 NOTE — Transfer of Care (Signed)
Immediate Anesthesia Transfer of Care Note  Patient: Nathaniel Nguyen  Procedure(s) Performed: COLONOSCOPY WITH PROPOFOL ESOPHAGOGASTRODUODENOSCOPY (EGD) WITH PROPOFOL  Patient Location: Endoscopy Unit  Anesthesia Type:General  Level of Consciousness: drowsy  Airway & Oxygen Therapy: Patient Spontanous Breathing  Post-op Assessment: Report given to RN and Post -op Vital signs reviewed and stable  Post vital signs: Reviewed and stable  Last Vitals:  Vitals Value Taken Time  BP 105/64 12/16/22 1116  Temp 35.8 1117  Pulse 57 12/16/22 1116  Resp 17 12/16/22 1116  SpO2 98 % 12/16/22 1116  Vitals shown include unvalidated device data.  Last Pain:  Vitals:   12/16/22 1115  TempSrc:   PainSc: 0-No pain         Complications: No notable events documented.

## 2022-12-16 NOTE — H&P (Signed)
Outpatient short stay form Pre-procedure 12/16/2022  Regis Bill, MD  Primary Physician: Barbette Reichmann, MD  Reason for visit:  IDA and screening colonoscopy  History of present illness:    77 y/o gentleman with history of hypertension, CKD 4, and glaucoma here for EGD for IDA and screening colonoscopy. Last colonoscopy in 2018 was normal. No blood thinners. Parents and brother had cancer but unclear what kind. 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    Current Facility-Administered Medications:    0.9 %  sodium chloride infusion, , Intravenous, Continuous, Marselino Slayton, Rossie Muskrat, MD  Medications Prior to Admission  Medication Sig Dispense Refill Last Dose   amLODipine (NORVASC) 5 MG tablet Take 5 mg by mouth daily.   12/15/2022   glimepiride (AMARYL) 2 MG tablet Take 2 mg by mouth daily.   12/15/2022   metoprolol succinate (TOPROL-XL) 100 MG 24 hr tablet Take 50 mg by mouth 2 (two) times daily.   12/16/2022   timolol (TIMOPTIC) 0.25 % ophthalmic solution Place 1 drop into the left eye in the morning.   Past Month   aspirin 81 MG chewable tablet Chew 81 mg by mouth daily.      atorvastatin (LIPITOR) 40 MG tablet Take 40 mg by mouth daily.        Allergies  Allergen Reactions   Metformin And Related      Past Medical History:  Diagnosis Date   Cardiomyopathy (HCC)    Chronic kidney disease    chronic, stage III   Coronary artery disease    Diabetes mellitus without complication (HCC)    Dysrhythmia    hx V-Tach   Glaucoma    Hypercholesterolemia    Hypertension    Myocardial infarction (HCC)    non Q wave MI   Severe sepsis (HCC) 02/08/2022    Review of systems:  Otherwise negative.    Physical Exam  Gen: Alert, oriented. Appears stated age.  HEENT: PERRLA. Lungs: No respiratory distress CV: RRR Abd: soft, benign, no masses Ext: No edema    Planned procedures: Proceed with EGD/colonoscopy. The  patient understands the nature of the planned procedure, indications, risks, alternatives and potential complications including but not limited to bleeding, infection, perforation, damage to internal organs and possible oversedation/side effects from anesthesia. The patient agrees and gives consent to proceed.  Please refer to procedure notes for findings, recommendations and patient disposition/instructions.     Regis Bill, MD [facility]

## 2022-12-17 ENCOUNTER — Encounter: Payer: Self-pay | Admitting: Gastroenterology

## 2022-12-19 LAB — SURGICAL PATHOLOGY

## 2023-06-17 ENCOUNTER — Ambulatory Visit
Admission: RE | Admit: 2023-06-17 | Discharge: 2023-06-17 | Disposition: A | Discharge status: Home / Self Care | Payer: Medicare PPO | Source: Ambulatory Visit | Attending: Orthopedic Surgery | Admitting: Orthopedic Surgery

## 2023-06-17 ENCOUNTER — Other Ambulatory Visit: Payer: Self-pay | Admitting: Orthopedic Surgery

## 2023-06-17 DIAGNOSIS — S62144A Nondisplaced fracture of body of hamate [unciform] bone, right wrist, initial encounter for closed fracture: Secondary | ICD-10-CM | POA: Insufficient documentation

## 2023-11-20 ENCOUNTER — Other Ambulatory Visit: Payer: Self-pay | Admitting: Internal Medicine

## 2023-11-20 DIAGNOSIS — D49519 Neoplasm of unspecified behavior of unspecified kidney: Secondary | ICD-10-CM

## 2023-11-20 DIAGNOSIS — R634 Abnormal weight loss: Secondary | ICD-10-CM

## 2023-11-20 DIAGNOSIS — N2889 Other specified disorders of kidney and ureter: Secondary | ICD-10-CM

## 2023-11-20 DIAGNOSIS — D649 Anemia, unspecified: Secondary | ICD-10-CM

## 2023-11-30 ENCOUNTER — Ambulatory Visit
Admission: RE | Admit: 2023-11-30 | Discharge: 2023-11-30 | Disposition: A | Discharge status: Home / Self Care | Source: Ambulatory Visit | Attending: Internal Medicine | Admitting: Internal Medicine

## 2023-11-30 DIAGNOSIS — D49519 Neoplasm of unspecified behavior of unspecified kidney: Secondary | ICD-10-CM | POA: Diagnosis present

## 2023-11-30 DIAGNOSIS — D649 Anemia, unspecified: Secondary | ICD-10-CM | POA: Insufficient documentation

## 2023-11-30 DIAGNOSIS — R634 Abnormal weight loss: Secondary | ICD-10-CM | POA: Insufficient documentation

## 2023-11-30 DIAGNOSIS — N2889 Other specified disorders of kidney and ureter: Secondary | ICD-10-CM | POA: Insufficient documentation

## 2024-03-30 NOTE — Progress Notes (Signed)
 [facility] New Consult Visit  Patient Name: Nathaniel Nguyen, male   Patient DOB: Feb 17, 1946 Date of Service: 03/30/2024  Patient MRN: [id number] Provider Creating Note: Woodward Brought, MD  [phone] Primary Care Physician: Sadie Manna, MD  [address] [place] [address] Additional Physicians/ Providers:    Impression/Recommendations   Nathaniel Nguyen is a 78 y.o. male with hypertension, diabetes mellitus type II, diabetic retinopathy, congestive heart failure, coronary artery disease, hyperlipidemia, and glaucoma who presents as a new patient for evaluation of chronic kidney disease stage IV. Creatinine 4.2, GFR of 14.  KFRE: 19.42% in 2 years and 49.04% in 5 years.   Chronic Kidney Disease stage IV: with proteinuria: GFR has been stable since 2020 but more recent labs show a GFR of 14. CKD secondary to hypertension and diabetes. No history of NSAIDs.  - not currently on an ACE-I/ARB - not currently on an SGLT-2 inhibitor.  - not currently on a mineralocorticoid receptor antagonist.  - avoid nonsteroidal anti-inflammatory agents - scheduled renal ultrasound - schedule kidney education class - patient understands the lifetime risk of requiring dialysis.   Hypertension with chronic kidney disease: 145/75 - current regimen of amlodipine , metoprolol  and tamsulosin.  - home blood pressure monitoring.   Diabetes mellitus type II with chronic kidney disease: noninsulin dependent. hemoglobin A1c of 7% on 03/18/24.  - currently diet controlled.   Anemia with chronic kidney disease: hemoglobin of 9.1. Normocytic.  - check CBC and SPEP/UPEP.   Hyperlipidemia:  - continue atorvastatin .    Problem List Patient Active Problem List  Diagnosis  . Congestive heart failure (HCC)  . Chronic kidney disease, Stage IV (severe) (HCC)  . Proteinuria  . 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 Type 2 diabetes mellitus with diabetic chronic kidney disease (HCC)  . Hyperlipidemia    Orders Placed This Encounter  Procedures  . Ultrasound renal complete    Standing Status:   Future    Number of Occurrences:   1    Expected Date:   03/30/2024    Expiration Date:   03/30/2025  . CBC and Differential    Standing Status:   Future    Number of Occurrences:   1    Expected Date:   03/30/2024    Expiration Date:   04/29/2025  . Kappa/Lambda free LT chains w/ratio, Serum    Standing Status:   Future    Number of Occurrences:   1    Expected Date:   03/30/2024    Expiration Date:   04/29/2025  . Hepatitis B Core Antibody, Total    Standing Status:   Future    Number of Occurrences:   1    Expected Date:   03/30/2024    Expiration Date:   04/29/2025  . Hepatitis B Surface Antigen    Standing Status:   Future    Number of Occurrences:   1    Expected Date:   03/30/2024    Expiration Date:   04/29/2025  . Hepatitis C antibody    Standing Status:   Future    Number of Occurrences:   1    Expected Date:   03/30/2024    Expiration Date:   04/29/2025  . Magnesium    Standing Status:   Future    Number of Occurrences:   1    Expected Date:   03/30/2024    Expiration Date:  04/29/2025  . Protein electrophoresis, serum    Standing Status:   Future    Number of Occurrences:   1    Expected Date:   03/30/2024    Expiration Date:   04/29/2025  . Protein Electrophoresis, Urine Random    Standing Status:   Future    Number of Occurrences:   1    Expected Date:   03/30/2024    Expiration Date:   04/29/2025  . PTH, Intact    Standing Status:   Future    Number of Occurrences:   1    Expected Date:   03/30/2024    Expiration Date:   04/29/2025  . Protein, Total, Random Urine w/Creatinine (Protein/Creat Ratio)    Standing Status:   Future    Number of Occurrences:   1    Expected Date:   03/30/2024    Expiration Date:   04/29/2025  . Comprehensive metabolic panel    Standing Status:   Future    Number  of Occurrences:   1    Expected Date:   03/30/2024    Expiration Date:   04/29/2025    Fasting?:   No  . Phosphorus    Standing Status:   Future    Number of Occurrences:   1    Expected Date:   03/30/2024    Expiration Date:   04/29/2025  . 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 Ultrasound renal complete  . CBC and Differential  . Kappa/Lambda free LT chains w/ratio, Serum  . Hepatitis B Core Antibody, Total  . Hepatitis B Surface Antigen  . Hepatitis C antibody  . Magnesium  . Protein electrophoresis, serum  . Protein Electrophoresis, Urine Random  . PTH, Intact  . Protein, Total, Random Urine w/Creatinine (Protein/Creat Ratio)  . Comprehensive metabolic panel  . Phosphorus  . POCT Urinalysis, manual with scope       Return in about 4 weeks (around 04/27/2024).   History of Present Illness   Chief Complaint  Patient presents with  . New     Nathaniel Nguyen is a 78 y.o. 2-Black or African American male who is being evaluated as a new patient today for chronic kidney disease stage IV. Patient presents today with his daughter who assists with history taking.   Patient's GFR has been below 30 since 2020. Patient states he has known he has chronic kidney disease for sometime. He has never been evaluated by a nephrologist.   Patient states his blood pressure is well controlled on current regimen. He denies any lightheadedness or dizziness. Patient denies any lower extremity swelling.   Patient states his glucose is  usually at goal. Denies any hypoglycemic events. He does have history of diabetic eye disease. He denies neuropathy. Denies history of insulin .   Patient denies use of nonsteroidal anti-inflammatory agents.      The following portions of the patient's chart were reviewed in this encounter and updated  as appropriate:  Tobacco  Allergies  Meds  Problems  Med Hx  Surg Hx  Fam Hx        Urine Studies   03/30/2024: urine microscopy: bland  History    Medications   Current Outpatient Medications:  .  albuterol HFA (PROVENTIL HFA;VENTOLIN HFA) 108 (90 Base) MCG/ACT inhaler, Inhale 1 puff every 30 minutes as needed, Disp: , Rfl:  .  allopurinol  (ZYLOPRIM ) 100 MG tablet, Take 100 mg by mouth in the morning. (Patient not taking: Reported on 03/30/2024), Disp: , Rfl:  .  amLODIPine  (NORVASC ) 5 MG tablet, Take 5 mg by mouth in the morning. (Patient not taking: Reported on 03/30/2024), Disp: , Rfl:  .  aspirin  81 MG chewable tablet, Chew 81 mg (Patient not taking: Reported on 03/30/2024), Disp: , Rfl:  .  atorvastatin  (LIPITOR) 40 MG tablet, Take 40 mg by mouth in the morning., Disp: , Rfl:  .  fluticasone (FLONASE) 50 MCG/ACT nasal spray, SHAKE LIQUID AND USE 2 SPRAYS IN EACH NOSTRIL DAILY, Disp: , Rfl:  .  glimepiride (AMARYL) 2 MG tablet, Take 2 mg by mouth 1 (one) time each day (Patient not taking: Reported on 03/30/2024), Disp: , Rfl:  .  metoprolol  succinate XL (TOPROL  XL) 25 MG 24 hr tablet, Take 12.5 mg by mouth in the morning and 12.5 mg in the evening., Disp: , Rfl:  .  tamsulosin (FLOMAX) 0.4 MG 24 hr capsule, TAKE 1 CAPSULE(0.4 MG) BY MOUTH DAILY 30 MINUTES AFTER THE SAME MEAL, Disp: , Rfl:  .  timolol  (TIMOPTIC ) 0.5 % ophthalmic solution, Administer 1 drop into affected eye(s) in the morning., Disp: , Rfl:  .  traMADol (ULTRAM) 50 MG tablet, Take 50 mg by mouth 2 times daily as needed (Patient not taking: Reported on 03/30/2024), Disp: , Rfl:    Allergies Metformin and  related  History Past Medical History:  Diagnosis Date  . Background diabetic retinopathy (HCC)   . Benign prostatic hyperplasia   . Chronic kidney disease, Stage IV (severe) (HCC)   . Congestive heart failure (HCC)   . Coronary atherosclerosis of unspecified type of vessel, native or graft   . Diverticulosis of colon   . Edema   . Glaucoma   . Gout   . Hyperlipidemia   . Hyperlipidemia   . 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 Proteinuria   . Type 2 diabetes mellitus with diabetic chronic kidney disease (HCC)   . Vitreous hemorrhage (HCC)     Past Surgical History:  Procedure Laterality Date  . ATRIAL ABLATION SURGERY    . CATARACT EXTRACTION, BILATERAL    . CHOLECYSTECTOMY    . LIVER LOBECTOMY     Family History  Problem Relation Age of Onset  . Hypertension Mother   . Kidney disease Father        Dialysis  . Cancer Father    Social History   Tobacco Use  . Smoking status: Never  . Smokeless tobacco: Never  Substance Use Topics  . Alcohol use: Yes    Comment: lightly- few        Physical Exam  Vitals BP 132/78 (BP Location: Right upper arm, Patient Position: Standing)   Pulse 71   Temp 98.1 F   Ht 5' 7 (1.702 m)   Wt 170 lb (77.1 kg)   SpO2 98%   BMI 26.63 kg/m   Vitals reviewed. Constitutional: He is oriented  to person, place, and time. He appears well-developed.  HEENT:  Head: Normocephalic and atraumatic. Mouth/Throat: Oropharynx is clear and moist.  Eyes: Pupils are equal, round, and reactive to light.  Neck: Neck supple.  Cardiovascular:  Normal rate and regular rhythm.           Pulmonary/Chest: Effort normal and breath sounds normal.  Abdominal: Soft.  Neurological: He is alert and oriented to person, place, and time.  Skin: Skin is warm and dry.     Laboratory Studies  Chemistry  Lab Units 03/18/24 1211 11/11/23 1106 12-18-22 0951 11/18/22 1155  09/02/22 1003 04/30/22 0947  SODIUM mmol/L 141 140 141 138 140 144  POTASSIUM mmol/L 4.8 4.8 4.8 4.5 5.0 4.8  CHLORIDE mmol/L 108 106 109 106 107 110*  CO2 mmol/L 23.2 25.1 20.0* 26.9 24.7 28.2  CALCIUM  mg/dL 8.0* 9.1 8.6* 8.9 8.7 9.1  PHOSPHORUS mg/dL 4.6  --   --   --   --   --   ALK PHOS U/L 75 86 95 93 83 75  GLUCOSE mg/dL 787* 828* 821* 860* 784* 47*  ALBUMIN  g/dL 4.1 4.3 3.8 4.0 4.0 4.4  BUN mg/dL 41* 36* 37* 38* 45* 53*  CREATININE mg/dL 4.2* 2.7* 3.1* 3.1* 3.4* 3.4*  HEMOGLOBIN A1C % 7.0*  --  6.7*  --   --  6.5*    Iron  Studies  Lab Units 03/18/24 1211 Dec 18, 2022 0951  FERRITIN ng/mL 188 169        Urine  Lab Units 03/30/24 1133 03/18/24 1211 11/11/23 1106 18-Dec-2022 0951 09/02/22 1003 04/30/22 0947  COLOR UA  Yellow  --   --   --   --   --   CLARITY UA  Clear  --   --   --   --   --   KETONES UA  Negative  --   --   --   --   --   PH UA  5.5  --   --   --   --   --   UROBILINOGEN UA  0.2  --   --   --   --   --   ALB MG/G CREAT UR ug/mg  --  106.9* 213.7* 360.4* 659.1* 192.7*        Woodward Brought, MD

## 2024-03-31 NOTE — Procedures (Signed)
 Complete Pulmonary Function Test  Referring Physician Marval Gum, NP  Reason for PFT DOE COPD unspecified type   SPIROMETRY: FVC was 3.03 L, 102 % of predicted FEV1 was 2.45 L, 110 % of predicted FEV1/FVC ratio was  107 % of predicted FEF 25-75% liters per second was 162 % of predicted  LUNG VOLUMES: TLC was 84 % of predicted RV was 85 % of predicted  DIFFUSION CAPACITY: DLCO was 69 % of predicted DLCO/VA was 87 % of predicted   Good patient effort with good repeatability.  Interpretation:   Interpreting Physician Dr. Parris   **See physician progress note for interpretation

## 2024-03-31 NOTE — Progress Notes (Signed)
 DIVISION OF PULMONARY AND CRITICAL CARE MEDICINE                                                       Routine f/u PATIENT ENCOUNTER                                         Chief Complaint: PFT f/u   History of Present Illness Nathaniel Nguyen is an 78 year old male with COPD who presents for follow-up regarding his lung health and breathing issues. He was initially referred by cardiology for evaluation of shortness of breath.    He denies current issues with shortness of breath while walking and no cough. He recalls previously experiencing shortness of breath but is unsure of the details. He has a history of smoking. Today's PFT showed normal spirometry with a low DLCO.   He has not used albuterol regularly since the last appointment, only testing it once without needing it for breathing issues. He dislikes the medication.  He mentions having a glaucoma doctor and education is given on certain medications for breathing could affect his glaucoma.  He is advised to review potential medication with his eye specialist  He did recently have labs drawn but the labs ordered for our clinic were not completed at that time.  Patient is agreeable to completing them today  Past medical History: He  has a past medical history of Cardiomyopathy (CMS/HHS-HCC), CKD (chronic kidney disease) (03/01/2013), Coronary artery disease, Diabetes mellitus type II (CMS/HHS-HCC) (1995), Diabetic retinopathy (CMS/HHS-HCC), Edema, Encounter for blood transfusion, Glaucoma (increased eye pressure), H/O wisdom tooth extraction (11/12/2015), Heart failure (CMS/HHS-HCC) (03/01/2013), Hypercholesterolemia, Hypertension, Kidney disease, chronic, stage III (GFR 30-59 ml/min) (CMS/HHS-HCC) (11/2012), Non Q wave myocardial infarction (CMS/HHS-HCC), Obesity, Ventricular tachycardia (CMS/HHS-HCC), and Vitreous hemorrhage, left eye (CMS/HHS-HCC) (04/09/2016).   Allergies:  is allergic to  metformin.  ROS: 10 point review of systems has been conducted during interview and evaluation today. Remainder has been reviewed and is negative except as per HPI  Current Medications (including changes made at this visit) Current Outpatient Medications  Medication Sig Dispense Refill  . albuterol MDI, PROVENTIL, VENTOLIN, PROAIR, HFA 90 mcg/actuation inhaler Inhale 1 Puff into the lungs every 4 (four) hours as needed for Shortness of Breath 18 each 1  . atorvastatin  (LIPITOR) 40 MG tablet TAKE 1 TABLET(40 MG) BY MOUTH EVERY DAY 90 tablet 1  . fluticasone propionate (FLONASE) 50 mcg/actuation nasal spray SHAKE LIQUID AND USE 2 SPRAYS IN EACH NOSTRIL DAILY 48 g 2  . inhalational spacer (AEROCHAMBER) spacer Use as instructed. 1 each 2  . metoprolol  succinate (TOPROL -XL) 25 MG XL tablet Take 0.5 tablets (12.5 mg total) by mouth 2 (two) times daily 90 tablet 3  . tamsulosin (FLOMAX) 0.4 mg capsule TAKE 1 CAPSULE(0.4 MG) BY MOUTH DAILY 30 MINUTES AFTER THE SAME MEAL 90 capsule 1  . timoloL  maleate (TIMOPTIC ) 0.5 % ophthalmic solution Place 1 drop into the left eye every morning before breakfast 10 mL 11  .  traMADoL (ULTRAM) 50 mg tablet TAKE 1 TABLET(50 MG) BY MOUTH TWICE DAILY AS NEEDED FOR PAIN 60 tablet 1  . cyanocobalamin (VITAMIN B12) 1000 MCG tablet Take 1 tablet (1,000 mcg total) by mouth once daily (Patient not taking: Reported on 03/18/2024) 90 tablet 1   No current facility-administered medications for this visit.      Social History:  He  reports that he quit smoking about 21 years ago. His smoking use included cigarettes. He started smoking about 55 years ago. He has a 23.1 pack-year smoking history. He has never used smokeless tobacco. He reports current alcohol use of about 6.0 standard drinks of alcohol per week. He reports that he does not use drugs.  Surgical History:  has a past surgical history that includes Eye surgery; VT abalation (2014); colonoscopy (12/22/2006); egd  (11/10/2011); Status post ablation; lens eye surgery (Right, 02/14/2014); extraction cataract extracapsular w/insertion intraocular prosthesis (Left, 08/25/2014); cardiac catheterization; vitreous retinal surgery (Right, 02/14/2014); vitreous retinal surgery (Left, 02/26/2016); Colonoscopy (06/08/2017); cholecystectomy; eye trauma; Colon @ [facility] (12/16/2022); and EGD @ [facility] (12/16/2022).  Family History: His family history includes Cancer in his father; Colon cancer in his brother; Diabetes in his brother; High blood pressure (Hypertension) in his mother; Stroke in his mother.  Physical Exam: BP (!) 153/83 (BP Location: Left upper arm, Patient Position: Sitting, BP Cuff Size: Adult)   Pulse 68   Ht 170.2 cm (5' 7)   Wt 77.1 kg (169 lb 15.6 oz)   SpO2 98% Comment: RA  BMI 26.62 kg/m   Physical Exam GENERAL: Alert, cooperative, well developed, no acute distress. HEENT: Normocephalic, normal oropharynx, moist mucous membranes. CHEST: Clear to auscultation bilaterally, no wheezes, rhonchi, or crackles. CARDIOVASCULAR: Heart strong, regular rate and rhythm, S1 and S2 normal without murmurs. ABDOMEN: Soft, non-tender, non-distended, without organomegaly, normal bowel sounds. EXTREMITIES: No cyanosis or edema. NEUROLOGICAL: Cranial nerves grossly intact, moves all extremities without gross motor or sensory deficit.   Results RADIOLOGY Chest X-ray: No consolidation, effusion, or edema. Cardiac silhouette not enlarged.  DIAGNOSTIC PFT 03/31/2024:  FEV1/FVC 107%; FVC 102%; FEV1 110%; TLC 84%; RV 85%; DLCO: 69%   MedicalDecision Making:   Patient has 1 or more acute or chronic illness or injury that poses a threat to life or bodily function. Review of prior external notes including review of results of blood work and imaging was performed today. Additional tests have been ordered, I have independently reviewed the chart and including microbiology, chemistry, imaging and procedures.       Impression/Plan:   1. COPD, mild (CMS/HHS-HCC)   2. SOB (shortness of breath)  3. Former cigarette smoker   4. Primary open angle glaucoma of right eye, unspecified glaucoma stage       Assessment & Plan Chronic obstructive pulmonary disease (COPD), mild Mild COPD with no significant dyspnea. DLCO at 69% suggests possible emphysema, but no CT scan has been performed. Chest x-ray showed no consolidation, effusion, or edema. Albuterol prescribed but not used regularly due to lack of significant respiratory symptoms. Discussed potential need for CT scan to evaluate possible restrict lung process or emphysema. Emphasized importance of ruling out hidden issues despite mild symptoms. - Ordered blood work including allergen, mold, ANA, sed rate, RA factor, D-dimer, BMP, and CMP to be completed - Consider future lung CT for evaluation of possible emphysema. - Follow up in 6 months to reassess symptoms. - Use albuterol inhaler as first-line treatment if dyspnea occurs. - Consider RSV vaccine due to pulmonary issues  and smoking history.  Former Smoker Patient reports quitting in 200.  He has a 23 pack total history  Glaucoma (type unspecified) Glaucoma management discussed in relation to potential interactions with respiratory medications. Importance of identifying specific type of glaucoma (open or closed angle) emphasized to avoid exacerbating the condition. Coordination with ophthalmologist necessary to ensure safe medication use. - Discuss with ophthalmologist about the use of muscarinics versus inhaled corticosteroids and their impact on glaucoma. - Provide ophthalmologist with a list of respiratory medications to determine any contraindications.   Recording duration: 36 minutes     PLAN: 6 months routine f/u     Patient relates understanding our goals for this visit and is agreeable to above plans.    Thank you for allowing me to participate in the care of this  patient.   This document was prepared using Dragon voice recognition software and may include unintentional dictation errors. This note has been created using automated tools and reviewed for accuracy by [facility].    Attestation Statement:   I personally performed the service, non-incident to. (WP)   DEBBIE MARIE BLITCH, NP

## 2024-04-01 ENCOUNTER — Other Ambulatory Visit: Payer: Self-pay | Admitting: Emergency Medicine

## 2024-04-01 ENCOUNTER — Inpatient Hospital Stay (HOSPITAL_COMMUNITY): Admission: RE | Admit: 2024-04-01 | Source: Ambulatory Visit

## 2024-04-01 ENCOUNTER — Ambulatory Visit
Admission: RE | Admit: 2024-04-01 | Discharge: 2024-04-01 | Disposition: A | Discharge status: Home / Self Care | Source: Ambulatory Visit | Attending: Emergency Medicine | Admitting: Emergency Medicine

## 2024-04-01 ENCOUNTER — Other Ambulatory Visit: Payer: Self-pay | Admitting: Nephrology

## 2024-04-01 DIAGNOSIS — I129 Hypertensive chronic kidney disease with stage 1 through stage 4 chronic kidney disease, or unspecified chronic kidney disease: Secondary | ICD-10-CM

## 2024-04-01 DIAGNOSIS — R7989 Other specified abnormal findings of blood chemistry: Secondary | ICD-10-CM

## 2024-04-01 DIAGNOSIS — R0602 Shortness of breath: Secondary | ICD-10-CM

## 2024-04-01 DIAGNOSIS — E1122 Type 2 diabetes mellitus with diabetic chronic kidney disease: Secondary | ICD-10-CM

## 2024-04-01 DIAGNOSIS — R8290 Unspecified abnormal findings in urine: Secondary | ICD-10-CM

## 2024-04-01 DIAGNOSIS — R809 Proteinuria, unspecified: Secondary | ICD-10-CM

## 2024-04-01 DIAGNOSIS — N184 Chronic kidney disease, stage 4 (severe): Secondary | ICD-10-CM

## 2024-04-01 MED ORDER — TECHNETIUM TO 99M ALBUMIN AGGREGATED
4.2000 | Freq: Once | INTRAVENOUS | Status: AC | PRN
  Administered 2024-04-01: 4.2 via INTRAVENOUS

## 2024-04-12 ENCOUNTER — Ambulatory Visit
Admission: RE | Admit: 2024-04-12 | Discharge: 2024-04-12 | Disposition: A | Discharge status: Home / Self Care | Source: Ambulatory Visit | Attending: Nephrology | Admitting: Nephrology

## 2024-04-12 DIAGNOSIS — R8290 Unspecified abnormal findings in urine: Secondary | ICD-10-CM | POA: Diagnosis present

## 2024-04-12 DIAGNOSIS — R809 Proteinuria, unspecified: Secondary | ICD-10-CM | POA: Insufficient documentation

## 2024-04-12 DIAGNOSIS — E1122 Type 2 diabetes mellitus with diabetic chronic kidney disease: Secondary | ICD-10-CM | POA: Insufficient documentation

## 2024-04-12 DIAGNOSIS — N184 Chronic kidney disease, stage 4 (severe): Secondary | ICD-10-CM | POA: Insufficient documentation

## 2024-04-12 DIAGNOSIS — I129 Hypertensive chronic kidney disease with stage 1 through stage 4 chronic kidney disease, or unspecified chronic kidney disease: Secondary | ICD-10-CM | POA: Insufficient documentation

## 2024-08-16 ENCOUNTER — Other Ambulatory Visit
Admission: RE | Admit: 2024-08-16 | Discharge: 2024-08-16 | Disposition: A | Discharge status: Home / Self Care | Source: Ambulatory Visit | Attending: Internal Medicine | Admitting: Internal Medicine

## 2024-08-16 DIAGNOSIS — I2510 Atherosclerotic heart disease of native coronary artery without angina pectoris: Secondary | ICD-10-CM | POA: Diagnosis present

## 2024-08-16 DIAGNOSIS — R0602 Shortness of breath: Secondary | ICD-10-CM | POA: Diagnosis present

## 2024-08-16 DIAGNOSIS — I214 Non-ST elevation (NSTEMI) myocardial infarction: Secondary | ICD-10-CM | POA: Insufficient documentation

## 2024-08-16 LAB — TROPONIN T, HIGH SENSITIVITY: Troponin T High Sensitivity: 50 ng/L — ABNORMAL HIGH (ref 0–19)
